# Patient Record
Sex: Female | Born: 1965 | Race: White | Hispanic: No | Smoking: Former smoker
Health system: Southern US, Community
[De-identification: ages and names within clinical notes are randomized; demographics above are authoritative.]

## PROBLEM LIST (undated history)

## (undated) DIAGNOSIS — Z9889 Other specified postprocedural states: Secondary | ICD-10-CM

## (undated) DIAGNOSIS — F329 Major depressive disorder, single episode, unspecified: Secondary | ICD-10-CM

## (undated) DIAGNOSIS — J309 Allergic rhinitis, unspecified: Secondary | ICD-10-CM

## (undated) DIAGNOSIS — J45909 Unspecified asthma, uncomplicated: Secondary | ICD-10-CM

## (undated) DIAGNOSIS — F32A Depression, unspecified: Secondary | ICD-10-CM

## (undated) DIAGNOSIS — M5126 Other intervertebral disc displacement, lumbar region: Secondary | ICD-10-CM

## (undated) DIAGNOSIS — K2970 Gastritis, unspecified, without bleeding: Secondary | ICD-10-CM

## (undated) DIAGNOSIS — M5136 Other intervertebral disc degeneration, lumbar region: Secondary | ICD-10-CM

## (undated) DIAGNOSIS — Z87891 Personal history of nicotine dependence: Secondary | ICD-10-CM

## (undated) DIAGNOSIS — F3181 Bipolar II disorder: Secondary | ICD-10-CM

## (undated) DIAGNOSIS — Z87898 Personal history of other specified conditions: Secondary | ICD-10-CM

## (undated) DIAGNOSIS — G4730 Sleep apnea, unspecified: Secondary | ICD-10-CM

## (undated) DIAGNOSIS — Q2112 Patent foramen ovale: Secondary | ICD-10-CM

## (undated) DIAGNOSIS — F419 Anxiety disorder, unspecified: Secondary | ICD-10-CM

## (undated) DIAGNOSIS — R112 Nausea with vomiting, unspecified: Secondary | ICD-10-CM

## (undated) DIAGNOSIS — M25561 Pain in right knee: Secondary | ICD-10-CM

## (undated) DIAGNOSIS — M51369 Other intervertebral disc degeneration, lumbar region without mention of lumbar back pain or lower extremity pain: Secondary | ICD-10-CM

## (undated) HISTORY — DX: Other intervertebral disc degeneration, lumbar region: M51.36

## (undated) HISTORY — DX: Major depressive disorder, single episode, unspecified: F32.9

## (undated) HISTORY — DX: Anxiety disorder, unspecified: F41.9

## (undated) HISTORY — DX: Other intervertebral disc displacement, lumbar region: M51.26

## (undated) HISTORY — DX: Unspecified asthma, uncomplicated: J45.909

## (undated) HISTORY — DX: Gastritis, unspecified, without bleeding: K29.70

## (undated) HISTORY — DX: Bipolar II disorder: F31.81

## (undated) HISTORY — DX: Depression, unspecified: F32.A

## (undated) HISTORY — DX: Pain in right knee: M25.561

## (undated) HISTORY — DX: Other intervertebral disc degeneration, lumbar region without mention of lumbar back pain or lower extremity pain: M51.369

## (undated) HISTORY — DX: Allergic rhinitis, unspecified: J30.9

## (undated) HISTORY — DX: Personal history of nicotine dependence: Z87.891

## (undated) HISTORY — PX: WISDOM TOOTH EXTRACTION: SHX21

## (undated) HISTORY — DX: Personal history of other specified conditions: Z87.898

---

## 1999-09-28 ENCOUNTER — Other Ambulatory Visit: Admission: RE | Admit: 1999-09-28 | Discharge: 1999-09-28 | Payer: Self-pay | Admitting: Obstetrics & Gynecology

## 2001-06-22 ENCOUNTER — Other Ambulatory Visit: Admission: RE | Admit: 2001-06-22 | Discharge: 2001-06-22 | Payer: Self-pay | Admitting: Obstetrics & Gynecology

## 2002-01-22 ENCOUNTER — Emergency Department (HOSPITAL_COMMUNITY): Admission: EM | Admit: 2002-01-22 | Discharge: 2002-01-22 | Payer: Self-pay

## 2002-08-23 ENCOUNTER — Other Ambulatory Visit: Admission: RE | Admit: 2002-08-23 | Discharge: 2002-08-23 | Payer: Self-pay | Admitting: Obstetrics & Gynecology

## 2003-10-10 ENCOUNTER — Other Ambulatory Visit: Admission: RE | Admit: 2003-10-10 | Discharge: 2003-10-10 | Payer: Self-pay | Admitting: Obstetrics & Gynecology

## 2003-10-17 ENCOUNTER — Encounter: Admission: RE | Admit: 2003-10-17 | Discharge: 2003-10-17 | Payer: Self-pay | Admitting: Obstetrics & Gynecology

## 2004-10-15 ENCOUNTER — Encounter: Admission: RE | Admit: 2004-10-15 | Discharge: 2004-10-15 | Payer: Self-pay | Admitting: Obstetrics & Gynecology

## 2004-10-29 ENCOUNTER — Encounter: Admission: RE | Admit: 2004-10-29 | Discharge: 2004-10-29 | Payer: Self-pay | Admitting: Obstetrics & Gynecology

## 2004-12-17 ENCOUNTER — Other Ambulatory Visit: Admission: RE | Admit: 2004-12-17 | Discharge: 2004-12-17 | Payer: Self-pay | Admitting: Obstetrics & Gynecology

## 2006-03-10 ENCOUNTER — Encounter: Admission: RE | Admit: 2006-03-10 | Discharge: 2006-03-10 | Payer: Self-pay | Admitting: Obstetrics & Gynecology

## 2006-03-12 ENCOUNTER — Encounter: Admission: RE | Admit: 2006-03-12 | Discharge: 2006-03-12 | Payer: Self-pay | Admitting: Obstetrics & Gynecology

## 2006-12-22 ENCOUNTER — Encounter: Admission: RE | Admit: 2006-12-22 | Payer: Self-pay | Admitting: Obstetrics & Gynecology

## 2008-03-24 ENCOUNTER — Encounter: Admission: RE | Admit: 2008-03-24 | Discharge: 2008-03-24 | Payer: Self-pay | Admitting: Obstetrics & Gynecology

## 2009-04-02 ENCOUNTER — Encounter: Admission: RE | Admit: 2009-04-02 | Discharge: 2009-04-02 | Payer: Self-pay | Admitting: Obstetrics & Gynecology

## 2010-04-02 ENCOUNTER — Encounter: Admission: RE | Admit: 2010-04-02 | Discharge: 2010-04-02 | Payer: Self-pay | Admitting: Obstetrics & Gynecology

## 2010-04-22 ENCOUNTER — Emergency Department (HOSPITAL_COMMUNITY)
Admission: EM | Admit: 2010-04-22 | Discharge: 2010-04-22 | Payer: Self-pay | Source: Home / Self Care | Admitting: Emergency Medicine

## 2010-08-22 HISTORY — PX: TURBINATE REDUCTION: SHX6157

## 2010-08-22 HISTORY — PX: SEPTOPLASTY: SUR1290

## 2010-08-22 HISTORY — PX: OTHER SURGICAL HISTORY: SHX169

## 2010-08-22 HISTORY — PX: ESOPHAGOGASTRODUODENOSCOPY: SHX1529

## 2010-08-23 ENCOUNTER — Emergency Department (HOSPITAL_COMMUNITY)
Admission: EM | Admit: 2010-08-23 | Discharge: 2010-08-23 | Disposition: A | Discharge status: Home / Self Care | Payer: 59 | Attending: Emergency Medicine | Admitting: Emergency Medicine

## 2010-08-23 DIAGNOSIS — R112 Nausea with vomiting, unspecified: Secondary | ICD-10-CM | POA: Insufficient documentation

## 2010-08-23 DIAGNOSIS — R197 Diarrhea, unspecified: Secondary | ICD-10-CM | POA: Insufficient documentation

## 2010-08-23 LAB — DIFFERENTIAL
Basophils Relative: 0 % (ref 0–1)
Eosinophils Absolute: 0.1 10*3/uL (ref 0.0–0.7)
Lymphocytes Relative: 16 % (ref 12–46)
Lymphs Abs: 1.1 10*3/uL (ref 0.7–4.0)

## 2010-08-23 LAB — BASIC METABOLIC PANEL
CO2: 23 mEq/L (ref 19–32)
Chloride: 112 mEq/L (ref 96–112)
Creatinine, Ser: 0.59 mg/dL (ref 0.4–1.2)
Potassium: 3.4 mEq/L — ABNORMAL LOW (ref 3.5–5.1)
Sodium: 138 mEq/L (ref 135–145)

## 2010-08-23 LAB — CBC
MCHC: 32.9 g/dL (ref 30.0–36.0)
RBC: 3.83 MIL/uL — ABNORMAL LOW (ref 3.87–5.11)
RDW: 12.2 % (ref 11.5–15.5)
WBC: 7 10*3/uL (ref 4.0–10.5)

## 2010-10-01 ENCOUNTER — Encounter (HOSPITAL_COMMUNITY): Payer: 59

## 2010-10-08 ENCOUNTER — Ambulatory Visit (HOSPITAL_COMMUNITY)
Admission: RE | Admit: 2010-10-08 | Discharge: 2010-10-08 | Disposition: A | Discharge status: Home / Self Care | Payer: 59 | Source: Ambulatory Visit | Attending: Family Medicine | Admitting: Family Medicine

## 2010-10-08 DIAGNOSIS — J45909 Unspecified asthma, uncomplicated: Secondary | ICD-10-CM | POA: Insufficient documentation

## 2011-06-01 ENCOUNTER — Other Ambulatory Visit: Payer: Self-pay | Admitting: Obstetrics & Gynecology

## 2011-06-01 DIAGNOSIS — Z1231 Encounter for screening mammogram for malignant neoplasm of breast: Secondary | ICD-10-CM

## 2011-06-14 ENCOUNTER — Ambulatory Visit
Admission: RE | Admit: 2011-06-14 | Discharge: 2011-06-14 | Disposition: A | Discharge status: Home / Self Care | Payer: 59 | Source: Ambulatory Visit | Attending: Obstetrics & Gynecology | Admitting: Obstetrics & Gynecology

## 2011-06-14 DIAGNOSIS — Z1231 Encounter for screening mammogram for malignant neoplasm of breast: Secondary | ICD-10-CM

## 2012-01-20 ENCOUNTER — Ambulatory Visit: Payer: Self-pay

## 2012-01-25 ENCOUNTER — Telehealth: Payer: Self-pay

## 2012-01-25 NOTE — Telephone Encounter (Signed)
LMOM to CB. We received lab results back (not in EPIC, results at [facility]' desk). Tell pt (per Dr Katrinka Blazing) that Hep B test was neg and there is not evidence of active Hep B.  Pt CB immediately and gave her results. She stated that she will RTC for add'l testing as instr'd.

## 2012-03-13 ENCOUNTER — Telehealth: Payer: Self-pay

## 2012-03-13 NOTE — Telephone Encounter (Signed)
PT STATES SHE IS TO GET MORE BLOOD WORK AND DIDN'T REMEMBER WHEN SHE IS TO COME BACK, DIDN'T KNOW IF IT WAS 6 WEEKS OR NOT PLEASE CALL [phone]

## 2012-03-13 NOTE — Telephone Encounter (Signed)
LMOM for pt to return call. 

## 2012-03-13 NOTE — Telephone Encounter (Signed)
Spoke to patient. This note should not be in Epic should be in [place].

## 2012-05-30 ENCOUNTER — Other Ambulatory Visit: Payer: Self-pay | Admitting: Obstetrics & Gynecology

## 2012-05-30 DIAGNOSIS — Z1231 Encounter for screening mammogram for malignant neoplasm of breast: Secondary | ICD-10-CM

## 2012-06-22 ENCOUNTER — Other Ambulatory Visit: Payer: Self-pay | Admitting: Physician Assistant

## 2012-06-23 LAB — ALT: ALT: 14 U/L (ref 0–35)

## 2012-07-04 ENCOUNTER — Ambulatory Visit
Admission: RE | Admit: 2012-07-04 | Discharge: 2012-07-04 | Disposition: A | Discharge status: Home / Self Care | Payer: 59 | Source: Ambulatory Visit | Attending: Obstetrics & Gynecology | Admitting: Obstetrics & Gynecology

## 2012-07-04 DIAGNOSIS — Z1231 Encounter for screening mammogram for malignant neoplasm of breast: Secondary | ICD-10-CM

## 2012-07-09 ENCOUNTER — Other Ambulatory Visit: Payer: Self-pay | Admitting: Obstetrics & Gynecology

## 2012-07-09 DIAGNOSIS — R928 Other abnormal and inconclusive findings on diagnostic imaging of breast: Secondary | ICD-10-CM

## 2012-07-11 ENCOUNTER — Ambulatory Visit
Admission: RE | Admit: 2012-07-11 | Discharge: 2012-07-11 | Disposition: A | Discharge status: Home / Self Care | Payer: 59 | Source: Ambulatory Visit | Attending: Obstetrics & Gynecology | Admitting: Obstetrics & Gynecology

## 2012-07-11 DIAGNOSIS — R928 Other abnormal and inconclusive findings on diagnostic imaging of breast: Secondary | ICD-10-CM

## 2012-07-13 ENCOUNTER — Other Ambulatory Visit: Payer: 59

## 2012-07-13 ENCOUNTER — Ambulatory Visit: Payer: 59

## 2012-07-16 ENCOUNTER — Telehealth: Payer: Self-pay | Admitting: *Deleted

## 2012-07-16 NOTE — Telephone Encounter (Signed)
Confirmed 07/30/12 genetic appt w/ pt.  Emailed Leigh at [facility] to make her aware.

## 2012-07-30 ENCOUNTER — Ambulatory Visit (HOSPITAL_BASED_OUTPATIENT_CLINIC_OR_DEPARTMENT_OTHER): Payer: 59 | Admitting: Genetic Counselor

## 2012-07-30 ENCOUNTER — Other Ambulatory Visit: Payer: 59 | Admitting: Lab

## 2012-07-30 DIAGNOSIS — Z8041 Family history of malignant neoplasm of ovary: Secondary | ICD-10-CM

## 2012-07-30 DIAGNOSIS — Z803 Family history of malignant neoplasm of breast: Secondary | ICD-10-CM

## 2012-07-31 ENCOUNTER — Encounter: Payer: Self-pay | Admitting: Genetic Counselor

## 2012-07-31 NOTE — Progress Notes (Signed)
Dr.  Carrington Clamp requested a consultation for genetic counseling and risk assessment for Mariah Vaughn, a 47 y.o. female, for discussion of her family history of breast and ovarian cancer. She presents to clinic today to discuss the possibility of a genetic predisposition to cancer, and to further clarify her risks, as well as her family members' risks for cancer.   HISTORY OF PRESENT ILLNESS: Mariah Vaughn is a 47 y.o. female with no personal history of cancer.    History reviewed. No pertinent past medical history.  History reviewed. No pertinent past surgical history.  History  Substance Use Topics  . Smoking status: Former Smoker -- 1.50 packs/day for 25 years    Types: Cigarettes    Quit date: 08/01/2003  . Smokeless tobacco: Not on file  . Alcohol Use: Yes     Comment: rarely    REPRODUCTIVE HISTORY AND PERSONAL RISK ASSESSMENT FACTORS: Menarche was at age 74.   premenopausal Uterus Intact: Yes Ovaries Intact: Yes [id number] , first live birth at age N/A  She has not previously undergone treatment for infertility.   OCP use for 1-2 monhts   She has not used HRT in the past.    FAMILY HISTORY:  We obtained a detailed, 4-generation family history.  Significant diagnoses are listed below: Family History  Problem Relation Age of Onset  . Breast cancer Mother 43  . Ovarian cancer Sister 56    maternal half sister  . Lung cancer Maternal Grandmother     worked at [employer]  . Prostate cancer Paternal Grandfather 96  The patient's maternal half sister had ovarian cancer at age 66.   Her mother had breast cancer at age 58, and maternal grandmother and her sister had lung cancer as a result of smoking and working at [employer].  The patient's paternal grandfather died of prostate cancer at age 105.  Patient's maternal ancestors are of [place], [place] and [place] descent, and paternal ancestors are of [place], [place], [place] and [place] descent. There  is no reported Ashkenazi Jewish ancestry. There is no known consanguinity.  GENETIC COUNSELING RISK ASSESSMENT, DISCUSSION, AND SUGGESTED FOLLOW UP: We reviewed the natural history and genetic etiology of sporadic, familial and hereditary cancer syndromes.  About 5-10% of breast cancer is hereditary.  Of this, about 85% is the result of a BRCA1 or BRCA2 mutation.  We reviewed the red flags of hereditary cancer syndromes and the dominant inheritance patterns.  If the BRCA testing is negative, we discussed that we could be testing for the wrong gene.  We discussed gene panels, and that several cancer genes that are associated with different cancers can be tested at the same time.  Because of the different types of cancer that are in the patient's family, we will consider one of the panel tests if she is negative for BRCA mutations.   The patient's family history of breast and ovarian cancer is suggestive of the following possible diagnosis: hereditary cancer syndrome  We discussed that identification of a hereditary cancer syndrome may help her care providers tailor the patients medical management. If a mutation indicating a hereditary cancer syndrome is detected in this case, the [employer] recommendations would include increased cancer surveillance and possible prophylactic surgery. If a mutation is detected, the patient will be referred back to the referring provider and to any additional appropriate care providers to discuss the relevant options.   If a mutation is not found in the patient,  cancer surveillance options would be discussed for the patient according to the appropriate standard National Comprehensive Cancer Network and American Cancer Society guidelines, with consideration of their personal and family history risk factors. In this case, the patient will be referred back to their care providers for discussions of management.   In order to estimate her chance of  having a BRCA mutation, we used statistical models (Tyrer Cusik) and laboratory data that take into account her personal medical history, family history and ancestry.  Because each model is different, there can be a lot of variability in the risks they give.  Therefore, these numbers must be considered a rough range and not a precise risk of having a BRCA mutation.  These models estimate that she has approximately a 1.5% chance of having a mutation.   Based on the patient's personal and family history, statistical models (Tyrer cusik)  and literature data were used to estimate her risk of developing breast cancer. These estimate her lifetime risk of developing breast cancer to be approximately 29%. This estimation does not take into account any genetic testing results.   After considering the risks, benefits, and limitations, the patient provided informed consent for  the following  testing: breast/ovarian cancer syndrome panel through [facility].   Per the patient's request, we will contact her by telephone to discuss these results. A follow up genetic counseling visit will be scheduled if indicated.  The patient was seen for a total of 60 minutes, greater than 50% of which was spent face-to-face counseling.  This plan is being carried out per Dr. Marcelino Duster Horvath's recommendations.  This note will also be sent to the referring provider via the electronic medical record. The patient will be supplied with a summary of this genetic counseling discussion as well as educational information on the discussed hereditary cancer syndromes following the conclusion of their visit.   Patient was discussed with Dr. Drue Second.  _______________________________________________________________________ For Office Staff:  Number of people involved in session: 2 Was an Intern/ student involved with case: no }

## 2012-08-30 ENCOUNTER — Telehealth: Payer: Self-pay | Admitting: Genetic Counselor

## 2012-08-30 NOTE — Telephone Encounter (Signed)
Revealed negative genetic test results 

## 2012-09-10 ENCOUNTER — Encounter: Payer: Self-pay | Admitting: Genetic Counselor

## 2013-05-08 ENCOUNTER — Telehealth: Payer: Self-pay | Admitting: Interventional Cardiology

## 2013-05-08 NOTE — Telephone Encounter (Signed)
Pt is aware that her primary cardiologist was Dr Nechama Guard . Pt has  last office visit was 06/30/2009, so she will be seen as a new pt. Pt states she has an appointment with her PCP today because she has some health issues, Pt states that her PCP will referred her to a cardiologist if she needs to see one.

## 2013-05-08 NOTE — Telephone Encounter (Signed)
New message   Pt called stating that she is not sure if Dr. Eldridge Dace is her Cardiologist// Pt states that she has not seen her cardiologist in over 1-2 years and she has recently felt symptoms of tachycardia.. Please call back to confirm if this pt is or isn't Dr. Hoyle Barr pt// Please assist.

## 2013-05-09 ENCOUNTER — Ambulatory Visit (INDEPENDENT_AMBULATORY_CARE_PROVIDER_SITE_OTHER): Payer: 59 | Admitting: Interventional Cardiology

## 2013-05-09 ENCOUNTER — Encounter: Payer: Self-pay | Admitting: Interventional Cardiology

## 2013-05-09 ENCOUNTER — Encounter (INDEPENDENT_AMBULATORY_CARE_PROVIDER_SITE_OTHER): Payer: Self-pay

## 2013-05-09 VITALS — BP 123/81 | HR 82 | Ht 64.0 in | Wt 135.0 lb

## 2013-05-09 DIAGNOSIS — R0602 Shortness of breath: Secondary | ICD-10-CM

## 2013-05-09 DIAGNOSIS — R9430 Abnormal result of cardiovascular function study, unspecified: Secondary | ICD-10-CM

## 2013-05-09 DIAGNOSIS — R079 Chest pain, unspecified: Secondary | ICD-10-CM

## 2013-05-09 LAB — BASIC METABOLIC PANEL
BUN: 11 mg/dL (ref 6–23)
Calcium: 9 mg/dL (ref 8.4–10.5)
Chloride: 106 mEq/L (ref 96–112)
Creatinine, Ser: 0.7 mg/dL (ref 0.4–1.2)

## 2013-05-09 NOTE — Progress Notes (Signed)
Patient ID: Odesser Tourangeau, female   DOB: 22-Nov-1965, 47 y.o.   MRN: [id number]    [address] [place], [place]  [address] Phone: [phone] Fax:  [phone]  Date:  05/09/2013   ID:  Mariah Vaughn, DOB 09-24-1965, MRN [id number]  PCP:  Neldon Labella, MD      History of Present Illness: Mariah Vaughn is a 47 y.o. female who has had chest pressure over the past 2 days.  She has had some intermittent SHOB.  THis got worse yesterday.  She wears a mask at work.  She has had some left arm numbness.  She has gained weight.  She has not been exercising of late. She does note if he pushes on a certain spot in her chest, she can reproduce the pain. There's been no associated nausea, sweating, vomiting. She has been under a fair amount of stress as well do to having worked 2 jobs.   Wt Readings from Last 3 Encounters:  05/09/13 190 lb (86.183 kg)     No past medical history on file.  Current Outpatient Prescriptions  Medication Sig Dispense Refill  . ABILIFY 5 MG tablet Take 5 mg by mouth once.       . citalopram (CELEXA) 10 MG tablet Take 10 mg by mouth daily.       Marland Kitchen NITROSTAT 0.4 MG SL tablet Place 0.4 mg under the tongue every 5 (five) minutes as needed.        No current facility-administered medications for this visit.    Allergies:    Allergies  Allergen Reactions  . Amoxicillin   . Codeine   . Keflex [Cephalexin]   . Sulfur     Social History:  The patient  reports that she quit smoking about 9 years ago. Her smoking use included Cigarettes. She has a 37.5 pack-year smoking history. She does not have any smokeless tobacco history on file. She reports that she drinks alcohol.   Family History:  The patient's family history includes Breast cancer (age of onset: 65) in her mother; Lung cancer in her maternal grandmother; Ovarian cancer (age of onset: 76) in her sister; Prostate cancer (age of onset: 79) in her paternal grandfather; Stroke in her father.    ROS:  Please see the history of present illness.  No nausea, vomiting.  No fevers, chills.  No focal weakness.  No dysuria.    All other systems reviewed and negative.   PHYSICAL EXAM: VS:  BP 123/81  Pulse 82  Ht 5\' 11"  (1.803 m)  Wt 190 lb (86.183 kg)  BMI 26.51 kg/m2 Well nourished, well developed, in no acute distress HEENT: normal Neck: no JVD, no carotid bruits Cardiac:  normal S1, S2; RRR;  Lungs:  clear to auscultation bilaterally, no wheezing, rhonchi or rales Abd: soft, nontender, no hepatomegaly Ext: no edema Skin: warm and dry Neuro:   no focal abnormalities noted  EKG:  Normal sinus rhythm, nonspecific ST segment changes in the inferior and lateral leads. Lateral Q waves present.  No significant change from prior ECG.  There may be lead placement issues since lead one now looks like lead aVL.    ASSESSMENT AND PLAN:  1.  CP- atypical.  Worse with palpation.  Reviewed ECG.  No significant change from 2011.  ECG.  Normal echo in 2011.  Plan for ETT.    2. SHOB: Check BNP.  Eval or fluid overload.  SHe has gained weight .  WIll need  to increase exercise in the long term.    3. abnormal ECG: There were the same ST segment changes present 2011. There is a new Q wave in lead 1. There were previously Q waves in lead aVL. She has not had convincing symptoms of ischemia. We'll see with exercise treadmill test shows.  Signed, Fredric Mare, MD, [facility] 05/09/2013 10:28 AM

## 2013-05-09 NOTE — Patient Instructions (Signed)
Your physician has requested that you have an exercise tolerance test. For further information please visit [email or web address] Please also follow instruction sheet, as given.  Your physician recommends that you return for lab work today for Bmet and BNP.

## 2013-05-10 DIAGNOSIS — R9430 Abnormal result of cardiovascular function study, unspecified: Secondary | ICD-10-CM | POA: Insufficient documentation

## 2013-05-27 ENCOUNTER — Encounter: Payer: 59 | Admitting: Interventional Cardiology

## 2013-06-14 ENCOUNTER — Ambulatory Visit (INDEPENDENT_AMBULATORY_CARE_PROVIDER_SITE_OTHER): Payer: 59 | Admitting: Physician Assistant

## 2013-06-14 ENCOUNTER — Encounter (INDEPENDENT_AMBULATORY_CARE_PROVIDER_SITE_OTHER): Payer: Self-pay

## 2013-06-14 DIAGNOSIS — R0602 Shortness of breath: Secondary | ICD-10-CM

## 2013-06-14 DIAGNOSIS — R079 Chest pain, unspecified: Secondary | ICD-10-CM

## 2013-06-14 NOTE — Progress Notes (Signed)
Exercise Treadmill Test  Pre-Exercise Testing Evaluation Rhythm: normal sinus  Rate: 77 bpm     Test  Exercise Tolerance Test Ordering MD: Casandra Doffing, MD  Interpreting MD: Richardson Dopp, PA-C  Unique Test No: 1  Treadmill:  1  Indication for ETT: chest pain - rule out ischemia  Contraindication to ETT: No   Stress Modality: exercise - treadmill  Cardiac Imaging Performed: non   Protocol: standard Bruce - maximal  Max BP:  148/65  Max MPHR (bpm):  175 85% MPR (bpm):  149  MPHR obtained (bpm):  176 % MPHR obtained:  100  Reached 85% MPHR (min:sec):  8:34 Total Exercise Time (min-sec):  10:03  Workload in METS:  11.8 Borg Scale: 16  Reason ETT Terminated:  patient's desire to stop    ST Segment Analysis At Rest: normal ST segments - no evidence of significant ST depression With Exercise: no evidence of significant ST depression  Other Information Arrhythmia:  No Angina during ETT:  absent (0) Quality of ETT:  diagnostic  ETT Interpretation:  normal - no evidence of ischemia by ST analysis  Comments: Good exercise capacity. No chest pain. Normal BP response to exercise. No ST changes to suggest ischemia.   Recommendations: F/u with Dr. Casandra Doffing as directed. Signed,  Richardson Dopp, PA-C   06/14/2013 9:44 AM

## 2013-07-22 ENCOUNTER — Other Ambulatory Visit: Payer: Self-pay | Admitting: Obstetrics & Gynecology

## 2013-07-22 DIAGNOSIS — N644 Mastodynia: Secondary | ICD-10-CM

## 2013-08-01 ENCOUNTER — Ambulatory Visit
Admission: RE | Admit: 2013-08-01 | Discharge: 2013-08-01 | Disposition: A | Discharge status: Home / Self Care | Payer: Self-pay | Source: Ambulatory Visit | Attending: Obstetrics & Gynecology | Admitting: Obstetrics & Gynecology

## 2013-08-01 ENCOUNTER — Ambulatory Visit
Admission: RE | Admit: 2013-08-01 | Discharge: 2013-08-01 | Disposition: A | Discharge status: Home / Self Care | Payer: 59 | Source: Ambulatory Visit | Attending: Obstetrics & Gynecology | Admitting: Obstetrics & Gynecology

## 2013-08-01 DIAGNOSIS — N644 Mastodynia: Secondary | ICD-10-CM

## 2014-01-02 ENCOUNTER — Other Ambulatory Visit: Payer: Self-pay | Admitting: Obstetrics & Gynecology

## 2014-01-02 DIAGNOSIS — N63 Unspecified lump in unspecified breast: Secondary | ICD-10-CM

## 2014-02-03 ENCOUNTER — Encounter (INDEPENDENT_AMBULATORY_CARE_PROVIDER_SITE_OTHER): Payer: Self-pay

## 2014-02-03 ENCOUNTER — Ambulatory Visit
Admission: RE | Admit: 2014-02-03 | Discharge: 2014-02-03 | Disposition: A | Discharge status: Home / Self Care | Payer: 59 | Source: Ambulatory Visit | Attending: Obstetrics & Gynecology | Admitting: Obstetrics & Gynecology

## 2014-02-03 DIAGNOSIS — N63 Unspecified lump in unspecified breast: Secondary | ICD-10-CM

## 2014-03-06 ENCOUNTER — Other Ambulatory Visit: Payer: Self-pay | Admitting: Family Medicine

## 2014-03-06 DIAGNOSIS — R208 Other disturbances of skin sensation: Secondary | ICD-10-CM

## 2014-03-10 ENCOUNTER — Ambulatory Visit
Admission: RE | Admit: 2014-03-10 | Discharge: 2014-03-10 | Disposition: A | Discharge status: Home / Self Care | Payer: 59 | Source: Ambulatory Visit | Attending: Family Medicine | Admitting: Family Medicine

## 2014-03-10 DIAGNOSIS — R208 Other disturbances of skin sensation: Secondary | ICD-10-CM

## 2014-03-10 MED ORDER — GADOBENATE DIMEGLUMINE 529 MG/ML IV SOLN
13.0000 mL | Freq: Once | INTRAVENOUS | Status: AC | PRN
  Administered 2014-03-10: 13 mL via INTRAVENOUS

## 2014-07-24 ENCOUNTER — Other Ambulatory Visit: Payer: Self-pay

## 2014-07-24 DIAGNOSIS — Z1231 Encounter for screening mammogram for malignant neoplasm of breast: Secondary | ICD-10-CM

## 2014-08-29 ENCOUNTER — Ambulatory Visit
Admission: RE | Admit: 2014-08-29 | Discharge: 2014-08-29 | Disposition: A | Discharge status: Home / Self Care | Payer: 59 | Source: Ambulatory Visit

## 2014-08-29 DIAGNOSIS — Z1231 Encounter for screening mammogram for malignant neoplasm of breast: Secondary | ICD-10-CM

## 2014-10-30 ENCOUNTER — Other Ambulatory Visit: Payer: Self-pay | Admitting: Family Medicine

## 2014-10-30 DIAGNOSIS — R1111 Vomiting without nausea: Secondary | ICD-10-CM

## 2014-11-03 ENCOUNTER — Ambulatory Visit
Admission: RE | Admit: 2014-11-03 | Discharge: 2014-11-03 | Disposition: A | Discharge status: Home / Self Care | Payer: 59 | Source: Ambulatory Visit | Attending: Family Medicine | Admitting: Family Medicine

## 2014-11-03 DIAGNOSIS — R1111 Vomiting without nausea: Secondary | ICD-10-CM

## 2014-12-22 HISTORY — PX: ESOPHAGOGASTRODUODENOSCOPY: SHX1529

## 2015-01-28 ENCOUNTER — Other Ambulatory Visit (HOSPITAL_COMMUNITY): Payer: Self-pay | Admitting: Gastroenterology

## 2015-01-28 DIAGNOSIS — R1110 Vomiting, unspecified: Secondary | ICD-10-CM

## 2015-02-13 ENCOUNTER — Ambulatory Visit (HOSPITAL_COMMUNITY)
Admission: RE | Admit: 2015-02-13 | Discharge: 2015-02-13 | Disposition: A | Discharge status: Home / Self Care | Payer: 59 | Source: Ambulatory Visit | Attending: Gastroenterology | Admitting: Gastroenterology

## 2015-02-13 DIAGNOSIS — R112 Nausea with vomiting, unspecified: Secondary | ICD-10-CM | POA: Insufficient documentation

## 2015-02-13 DIAGNOSIS — R1110 Vomiting, unspecified: Secondary | ICD-10-CM

## 2015-02-13 MED ORDER — TECHNETIUM TC 99M MEBROFENIN IV KIT
5.4000 | PACK | Freq: Once | INTRAVENOUS | Status: DC | PRN
  Administered 2015-02-13: 5 via INTRAVENOUS
  Filled 2015-02-13: qty 6

## 2015-02-20 ENCOUNTER — Ambulatory Visit (HOSPITAL_COMMUNITY): Payer: 59

## 2015-03-06 ENCOUNTER — Ambulatory Visit (HOSPITAL_COMMUNITY)
Admission: RE | Admit: 2015-03-06 | Discharge: 2015-03-06 | Disposition: A | Discharge status: Home / Self Care | Payer: 59 | Source: Ambulatory Visit | Attending: Gastroenterology | Admitting: Gastroenterology

## 2015-03-06 DIAGNOSIS — R6881 Early satiety: Secondary | ICD-10-CM | POA: Insufficient documentation

## 2015-03-06 DIAGNOSIS — R1110 Vomiting, unspecified: Secondary | ICD-10-CM

## 2015-03-06 DIAGNOSIS — R112 Nausea with vomiting, unspecified: Secondary | ICD-10-CM | POA: Diagnosis not present

## 2015-03-06 DIAGNOSIS — R140 Abdominal distension (gaseous): Secondary | ICD-10-CM | POA: Insufficient documentation

## 2015-03-06 DIAGNOSIS — R1013 Epigastric pain: Secondary | ICD-10-CM | POA: Diagnosis not present

## 2015-03-06 MED ORDER — TECHNETIUM TC 99M SULFUR COLLOID
2.1000 | Freq: Once | INTRAVENOUS | Status: DC | PRN
  Administered 2015-03-06: 2.1 via ORAL
  Filled 2015-03-06: qty 2.1

## 2015-07-15 ENCOUNTER — Ambulatory Visit: Payer: Self-pay

## 2015-07-15 ENCOUNTER — Other Ambulatory Visit: Payer: Self-pay | Admitting: Occupational Medicine

## 2015-07-15 DIAGNOSIS — M25532 Pain in left wrist: Secondary | ICD-10-CM

## 2015-07-29 ENCOUNTER — Ambulatory Visit: Payer: Self-pay

## 2015-07-29 ENCOUNTER — Other Ambulatory Visit: Payer: Self-pay | Admitting: Occupational Medicine

## 2015-07-29 DIAGNOSIS — M25532 Pain in left wrist: Secondary | ICD-10-CM

## 2015-08-25 ENCOUNTER — Other Ambulatory Visit: Payer: Self-pay | Admitting: Family Medicine

## 2015-08-25 DIAGNOSIS — M7989 Other specified soft tissue disorders: Secondary | ICD-10-CM

## 2015-08-30 ENCOUNTER — Ambulatory Visit
Admission: RE | Admit: 2015-08-30 | Discharge: 2015-08-30 | Disposition: A | Discharge status: Home / Self Care | Payer: 59 | Source: Ambulatory Visit | Attending: Family Medicine | Admitting: Family Medicine

## 2015-08-30 DIAGNOSIS — M7989 Other specified soft tissue disorders: Secondary | ICD-10-CM

## 2015-08-30 MED ORDER — GADOBENATE DIMEGLUMINE 529 MG/ML IV SOLN
14.0000 mL | Freq: Once | INTRAVENOUS | Status: AC | PRN
  Administered 2015-08-30: 14 mL via INTRAVENOUS

## 2015-11-30 ENCOUNTER — Other Ambulatory Visit: Payer: Self-pay | Admitting: Obstetrics and Gynecology

## 2015-11-30 ENCOUNTER — Other Ambulatory Visit: Payer: Self-pay | Admitting: Obstetrics & Gynecology

## 2015-11-30 DIAGNOSIS — Z1231 Encounter for screening mammogram for malignant neoplasm of breast: Secondary | ICD-10-CM

## 2015-12-01 ENCOUNTER — Ambulatory Visit
Admission: RE | Admit: 2015-12-01 | Discharge: 2015-12-01 | Disposition: A | Discharge status: Home / Self Care | Payer: 59 | Source: Ambulatory Visit | Attending: Obstetrics and Gynecology | Admitting: Obstetrics and Gynecology

## 2015-12-01 DIAGNOSIS — Z1231 Encounter for screening mammogram for malignant neoplasm of breast: Secondary | ICD-10-CM

## 2016-06-16 ENCOUNTER — Emergency Department (HOSPITAL_COMMUNITY)
Admission: EM | Admit: 2016-06-16 | Discharge: 2016-06-16 | Disposition: A | Discharge status: Home / Self Care | Payer: 59

## 2016-06-16 DIAGNOSIS — S61210A Laceration without foreign body of right index finger without damage to nail, initial encounter: Secondary | ICD-10-CM | POA: Diagnosis not present

## 2016-06-21 DIAGNOSIS — S61210D Laceration without foreign body of right index finger without damage to nail, subsequent encounter: Secondary | ICD-10-CM | POA: Diagnosis not present

## 2016-06-27 DIAGNOSIS — S61210D Laceration without foreign body of right index finger without damage to nail, subsequent encounter: Secondary | ICD-10-CM | POA: Diagnosis not present

## 2016-06-30 DIAGNOSIS — S61210D Laceration without foreign body of right index finger without damage to nail, subsequent encounter: Secondary | ICD-10-CM | POA: Diagnosis not present

## 2016-08-31 DIAGNOSIS — Z1159 Encounter for screening for other viral diseases: Secondary | ICD-10-CM | POA: Diagnosis not present

## 2016-08-31 DIAGNOSIS — Z0000 Encounter for general adult medical examination without abnormal findings: Secondary | ICD-10-CM | POA: Diagnosis not present

## 2016-08-31 DIAGNOSIS — Z20828 Contact with and (suspected) exposure to other viral communicable diseases: Secondary | ICD-10-CM | POA: Diagnosis not present

## 2016-09-20 ENCOUNTER — Other Ambulatory Visit (HOSPITAL_BASED_OUTPATIENT_CLINIC_OR_DEPARTMENT_OTHER): Payer: Self-pay | Admitting: Family Medicine

## 2016-09-20 ENCOUNTER — Other Ambulatory Visit: Payer: Self-pay | Admitting: Family Medicine

## 2016-09-20 ENCOUNTER — Ambulatory Visit
Admission: RE | Admit: 2016-09-20 | Discharge: 2016-09-20 | Disposition: A | Discharge status: Home / Self Care | Payer: 59 | Source: Ambulatory Visit | Attending: Family Medicine | Admitting: Family Medicine

## 2016-09-20 ENCOUNTER — Ambulatory Visit (HOSPITAL_BASED_OUTPATIENT_CLINIC_OR_DEPARTMENT_OTHER)
Admission: RE | Admit: 2016-09-20 | Discharge: 2016-09-20 | Disposition: A | Discharge status: Home / Self Care | Payer: 59 | Source: Ambulatory Visit | Attending: Family Medicine | Admitting: Family Medicine

## 2016-09-20 DIAGNOSIS — R0609 Other forms of dyspnea: Secondary | ICD-10-CM | POA: Insufficient documentation

## 2016-09-20 DIAGNOSIS — R7989 Other specified abnormal findings of blood chemistry: Secondary | ICD-10-CM

## 2016-09-20 DIAGNOSIS — R0789 Other chest pain: Secondary | ICD-10-CM | POA: Diagnosis not present

## 2016-09-20 DIAGNOSIS — R0602 Shortness of breath: Secondary | ICD-10-CM

## 2016-09-20 MED ORDER — IOPAMIDOL (ISOVUE-370) INJECTION 76%
100.0000 mL | Freq: Once | INTRAVENOUS | Status: AC | PRN
  Administered 2016-09-20: 100 mL via INTRAVENOUS

## 2016-09-22 ENCOUNTER — Telehealth: Payer: Self-pay

## 2016-09-22 NOTE — Telephone Encounter (Signed)
SENT NOTES TO SCHEDULING 

## 2016-09-29 ENCOUNTER — Encounter: Payer: Self-pay | Admitting: Physician Assistant

## 2016-10-05 ENCOUNTER — Ambulatory Visit (INDEPENDENT_AMBULATORY_CARE_PROVIDER_SITE_OTHER): Payer: 59 | Admitting: Internal Medicine

## 2016-10-05 ENCOUNTER — Encounter: Payer: Self-pay | Admitting: Internal Medicine

## 2016-10-05 VITALS — BP 114/70 | HR 72 | Ht 63.0 in | Wt 155.8 lb

## 2016-10-05 DIAGNOSIS — R55 Syncope and collapse: Secondary | ICD-10-CM | POA: Diagnosis not present

## 2016-10-05 DIAGNOSIS — R0602 Shortness of breath: Secondary | ICD-10-CM

## 2016-10-05 NOTE — Progress Notes (Signed)
Cardiology Office Note   Date:  10/05/2016   ID:  MALLEY HAUTER, DOB Oct 30, 1965, MRN [id number]  PCP:  Kathyrn Lass, MD  Cardiologist:   Dorris Carnes, MD   Pt referred by Dr Sabra Heck for SOB     History of Present Illness:  Mariah Vaughn is a 51 y.o. female with a history of chest pain, SOB   She was seen by Lendell Caprice remotely  Pain felt to be muscular.  Worse with palpation  Followed by [facility]   Seen on 5/1   For about 8 months has had tingling down L arm  Has also had pains in neck Pt says that with any exertion she gets SOB  Does work at home   OUt of breath  WOrse in past 3 months    [name] in clinic  D dimer +  Went on to have CT scan which was  negatvie    StillSOB  Inhaler not helping   Yesterday she was Doing yard work  Had neck pain  Felt dizzy doing it  Then passed out    Felt heart beat in head then fell out  Pt says she  Has had tachycardia  (150s in past)  HR was  120 yesterday    Dizzy all the time for the past year  Worse recently  Has alos been vomiting     Current Meds  Medication Sig  . albuterol (PROVENTIL HFA;VENTOLIN HFA) 108 (90 Base) MCG/ACT inhaler Inhale 2 puffs into the lungs every 4 (four) hours as needed for wheezing or shortness of breath.  . Fexofenadine HCl (ALLEGRA PO) Take 1 tablet by mouth daily.  . meclizine (ANTIVERT) 12.5 MG tablet Take 1 tablet by mouth every 8 (eight) hours as needed for dizziness.  . Melatonin Gummies 2.5 MG CHEW Chew 1 Dose by mouth at bedtime as needed (sleep).  . Naproxen (NAPROSYN PO) Take 1 tablet by mouth daily as needed. cramps  . NITROSTAT 0.4 MG SL tablet Place 0.4 mg under the tongue every 5 (five) minutes as needed for chest pain.   Marland Kitchen ONDANSETRON HCL PO Take 8 mg by mouth 3 (three) times daily as needed (nausea or vomiting).   . valACYclovir (VALTREX) 1000 MG tablet Take 2,000 mg by mouth as directed. TAKE 2 TABLETS EVERY 12 HRS X 1 DAY PRN FOR OUTBREAK      Allergies:   Amoxicillin; Cefaclor;  Codeine; Doxycycline; Flonase [fluticasone propionate]; Keflex [cephalexin]; Moviprep [peg-kcl-nacl-nasulf-na asc-c]; Penicillins; Sulfur; and Tamiflu [oseltamivir phosphate]   Past Medical History:  Diagnosis Date  . Allergic rhinitis   . Anxiety   . Asthma   . Bipolar 2 disorder ([place])   . Bulging lumbar disc    C SPINE  . Depression   . Gastritis    GANEM  . History of palpitations    HX OF PAC'S AND HX OF PVC'S  . Hx of smoking   . Knee pain, right     Past Surgical History:  Procedure Laterality Date  . COLONSCOPY  08/2010  . ESOPHAGOGASTRODUODENOSCOPY  08/2010  . ESOPHAGOGASTRODUODENOSCOPY  12/2014  . SEPTOPLASTY  08/2010  . TURBINATE REDUCTION  08/2010  . WISDOM TOOTH EXTRACTION     AGE 89     Social History:  The patient  reports that she quit smoking about 13 years ago. Her smoking use included Cigarettes. She has a 37.50 pack-year smoking history. She has never used smokeless tobacco. She reports that she drinks alcohol.  She reports that she does not use drugs.   Family History:  The patient's family history includes ADD / ADHD in her son; Atrial fibrillation in her mother; Autism in her son; Breast cancer (age of onset: 39) in her mother; Lung cancer in her maternal grandmother; Other in her father; Ovarian cancer (age of onset: 24) in her sister; Pancreatitis in her father; Pneumonia in her father; Prostate cancer (age of onset: 25) in her paternal grandfather; Stroke in her father.    ROS:  Please see the history of present illness. All other systems are reviewed and  Negative to the above problem except as noted.    PHYSICAL EXAM: VS:  BP 114/70   Pulse 72   Ht 5\' 3"  (1.6 m)   Wt 155 lb 12.8 oz (70.7 kg)   SpO2 99%   BMI 27.60 kg/m   GEN: Well nourished, well developed, in no acute distress  HEENT: normal  Neck: no JVD, carotid bruits, or masses Cardiac: RRR; no murmurs, rubs, or gallops,no edema  Respiratory:  clear to auscultation bilaterally, normal  work of breathing GI: soft, nontender, nondistended, + BS  No hepatomegaly  MS: no deformity Moving all extremities   Skin: warm and dry, no rash Neuro:  Strength and sensation are intact Psych: euthymic mood, full affect   EKG:  EKG is  ordered today.  SR 73     Lipid Panel No results found for: CHOL, TRIG, HDL, CHOLHDL, VLDL, LDLCALC, LDLDIRECT    Wt Readings from Last 3 Encounters:  10/05/16 155 lb 12.8 oz (70.7 kg)  05/09/13 135 lb (61.2 kg)      ASSESSMENT AND PLAN:  1  Dypsnea.  Exam without evid of volume overload  CT did not comment on signif calcifications of coronaries   WIll get labs from primeary MD  I would also recomm an echo ot evla LV systolic /diastolic function  Further testing based on test results  2  Syncope  Pt was dizzy earlier while working in yard.  Sounds orthostatic  Will review echo   Current medicines are reviewed at length with the patient today.  The patient does not have concerns regarding medicines.  Signed, Dorris Carnes, MD  10/05/2016 3:57 PM    [address], [place]  [address] Phone: [phone]; Fax: [phone]

## 2016-10-05 NOTE — Patient Instructions (Signed)

## 2016-10-07 ENCOUNTER — Ambulatory Visit (INDEPENDENT_AMBULATORY_CARE_PROVIDER_SITE_OTHER): Payer: 59 | Admitting: Internal Medicine

## 2016-10-07 ENCOUNTER — Encounter: Payer: Self-pay | Admitting: *Deleted

## 2016-10-07 ENCOUNTER — Encounter: Payer: Self-pay | Admitting: Internal Medicine

## 2016-10-07 ENCOUNTER — Other Ambulatory Visit (INDEPENDENT_AMBULATORY_CARE_PROVIDER_SITE_OTHER): Payer: 59

## 2016-10-07 VITALS — BP 110/66 | HR 91 | Ht 63.0 in | Wt 158.0 lb

## 2016-10-07 DIAGNOSIS — R0609 Other forms of dyspnea: Secondary | ICD-10-CM | POA: Diagnosis not present

## 2016-10-07 DIAGNOSIS — R0600 Dyspnea, unspecified: Secondary | ICD-10-CM

## 2016-10-07 LAB — BASIC METABOLIC PANEL
BUN: 12 mg/dL (ref 6–23)
CO2: 30 meq/L (ref 19–32)
Calcium: 9.2 mg/dL (ref 8.4–10.5)
Chloride: 106 mEq/L (ref 96–112)
Creatinine, Ser: 0.75 mg/dL (ref 0.40–1.20)
GFR: 87.41 mL/min (ref >60.00)
GLUCOSE: 102 mg/dL — AB (ref 70–99)
POTASSIUM: 4.2 meq/L (ref 3.5–5.1)
SODIUM: 141 meq/L (ref 135–145)

## 2016-10-07 LAB — BRAIN NATRIURETIC PEPTIDE: PRO B NATRI PEPTIDE: 24 pg/mL (ref 0.0–100.0)

## 2016-10-07 MED ORDER — FAMOTIDINE 20 MG PO TABS: ORAL_TABLET | ORAL | 11 refills | Status: DC

## 2016-10-07 MED ORDER — PANTOPRAZOLE SODIUM 40 MG PO TBEC: 40.0000 mg | DELAYED_RELEASE_TABLET | Freq: Every day | ORAL | 2 refills | Status: DC

## 2016-10-07 NOTE — Progress Notes (Signed)
Spoke with pt and notified of results per Dr. Wert. Pt verbalized understanding and denied any questions. 

## 2016-10-07 NOTE — Patient Instructions (Addendum)
Pantoprazole (protonix) 40 mg   Take  30-60 min before first meal of the day and Pepcid (famotidine)  20 mg one @  bedtime until return to office - this is the best way to tell whether stomach acid is contributing to your problem.   GERD (REFLUX)  is an extremely common cause of respiratory symptoms just like yours , many times with no obvious heartburn at all.    It can be treated with medication, but also with lifestyle changes including elevation of the head of your bed (ideally with 6 inch  bed blocks),  Smoking cessation, avoidance of late meals, excessive alcohol, and avoid fatty foods, chocolate, peppermint, colas, red wine, and acidic juices such as orange juice.  NO MINT OR MENTHOL PRODUCTS SO NO COUGH DROPS   USE SUGARLESS CANDY INSTEAD (Jolley ranchers or Stover's or Life Savers) or even ice chips will also do - the key is to swallow to prevent all throat clearing. NO OIL BASED VITAMINS - use powdered substitutes.    Please remember to go to the lab   department downstairs in the basement  for your tests - we will call you with the results when they are available.   To get the most out of exercise, you need to be continuously aware that you are short of breath, but never out of breath, for 30 minutes daily. As you improve, it will actually be easier for you to do the same amount of exercise  in  30 minutes so always push to the level where you are short of breath.      Please schedule a follow up office visit in 6 weeks, call sooner if needed

## 2016-10-07 NOTE — Progress Notes (Signed)
Subjective:     Patient ID: Mariah Vaughn, female   DOB: 1965/12/12, 51 y.o.   MRN: [id number] HPI  62 yowf works as Copywriter, advertising and started smoking age 40 then developed recurrent bronchitis pattern in teens / lots of breathng meds/ theoph but much better  once quit in 2006 but still would need albuterol with exp to fragrances /dust / mold eval by Shaune Leeks since age 64 with last eval 2014 and dx asthma and cat allergy and not since then rarely needing albuterol at all ok doing aerobics / ok sleeping but since then indolent onset fall 2017 progressively worse doe so referred to pulmonary clinic 10/07/2016 by Dr   Kathyrn Lass p Pos D dimer > CTa 09/20/16 neg PE/ ILD     10/07/2016 1st [facility] office visit/ Wert   Chief Complaint  Patient presents with  . Pulmonary Consult    Referred by Dr. Kathyrn Lass. Pt states she has hx of Asthma and Chronic Bronchitis. She c/o increased SOB for the past 8 months. She states she gets winded with exertion such as housework.  She states she had elevated D Dimer 2 wks ago with neg CTA. No dopplers were done.    indolent onset progressive doe / chest tightness / no better with saba  MMRC1 = can walk nl pace, flat grade, can't hurry or go uphills or steps s sob   No assoc cough/ wheeze/ noct symptoms    No obvious day to day or daytime variability or assoc excess/ purulent sputum or mucus plugs or hemoptysis or cp or chest tightness, subjective wheeze or overt sinus or hb symptoms. No unusual exp hx or h/o childhood pna  or knowledge of premature birth.  Sleeping ok without nocturnal  or early am exacerbation  of respiratory  c/o's or need for noct saba. Also denies any obvious fluctuation of symptoms with weather or environmental changes or other aggravating or alleviating factors except as outlined above   Current Medications, Allergies, Complete Past Medical History, Past Surgical History, Family History, and Social History were reviewed in  [employer] record.  ROS  The following are not active complaints unless bolded sore throat, dysphagia, dental problems, itching, sneezing,  nasal congestion or excess/ purulent secretions, ear ache,   fever, chills, sweats, unintended wt loss, classically pleuritic or exertional cp,  orthopnea pnd or leg swelling, presyncope, palpitations, abdominal pain, anorexia, nausea, vomiting, diarrhea  or change in bowel or bladder habits, change in stools or urine, dysuria,hematuria,  rash, arthralgias, visual complaints, headache, numbness, weakness or ataxia or problems with walking or coordination,  change in mood/anxious and depressed affect or memory.              Review of Systems     Objective:   Physical Exam    amb wf nad talking a mile a minute   Wt Readings from Last 3 Encounters:  10/07/16 158 lb (71.7 kg)  10/05/16 155 lb 12.8 oz (70.7 kg)  05/09/13 135 lb (61.2 kg)    Vital signs reviewed    HEENT: nl dentition, turbinates bilaterally, and oropharynx. Nl external ear canals without cough reflex   NECK :  without JVD/Nodes/TM/ nl carotid upstrokes bilaterally   LUNGS: no acc muscle use,  Nl contour chest which is clear to A and P bilaterally without cough on insp or exp maneuvers   CV:  RRR  no s3 or murmur or increase in P2, and no edema  ABD:  soft and nontender with nl inspiratory excursion in the supine position. No bruits or organomegaly appreciated, bowel sounds nl  MS:  Nl gait/ ext warm without deformities, calf tenderness, cyanosis or clubbing No obvious joint restrictions   SKIN: warm and dry without lesions    NEURO:  alert, approp, nl sensorium with  no motor or cerebellar deficits apparent.     I personally reviewed images and agree with radiology impression as follows:  CTa  Chest  09/20/16 No definite evidence of pulmonary embolus. No acute cardiopulmonary abnormality seen.    Labs reviewed from 09/20/16 TSH/ cbc nl  D  dimer 0.60   Labs ordered/ reviewed:      Chemistry      Component Value Date/Time   NA 141 10/07/2016 1556   K 4.2 10/07/2016 1556   CL 106 10/07/2016 1556   CO2 30 10/07/2016 1556   BUN 12 10/07/2016 1556   CREATININE 0.75 10/07/2016 1556      Component Value Date/Time   CALCIUM 9.2 10/07/2016 1556   ALT 14 06/22/2012 1645           Lab Results  Component Value Date   PROBNP 24.0 10/07/2016           Assessment:

## 2016-10-08 NOTE — Assessment & Plan Note (Addendum)
Spirometry 10/07/2016  wnl with nl curvature  s rx prior - 10/07/2016  Walked RA x 3 laps @ 185 ft each stopped due to  End of study, fast pace, no desat but at end so  Symptoms are markedly disproportionate to objective findings and not clear this is a lung problem but pt does appear to have difficult airway management issues. DDX of  difficult airways management almost all start with A and  include Adherence, Ace Inhibitors, Acid Reflux, Active Sinus Disease, Alpha 1 Antitripsin deficiency, Anxiety masquerading as Airways dz,  ABPA,  Allergy(esp in young), Aspiration (esp in elderly), Adverse effects of meds,  Active smokers, A bunch of PE's (a small clot burden can't cause this syndrome unless there is already severe underlying pulm or vascular dz with poor reserve) plus two Bs  = Bronchiectasis and Beta blocker use..and one C= CHF   Adherence is always the initial "prime suspect" and is a multilayered concern that requires a "trust but verify" approach in every patient - starting with knowing how to use medications, especially inhalers, correctly, keeping up with refills and understanding the fundamental difference between maintenance and prns vs those medications only taken for a very short course and then stopped and not refilled.   ? Acid (or non-acid) GERD > always difficult to exclude as up to 75% of pts in some series report no assoc GI/ Heartburn symptoms> rec max (24h)  acid suppression and diet restrictions/ reviewed and instructions given in writing.   ? Anxiety > usually at the bottom of this list of usual suspects but should be much higher on this pt's based on H and P and note previously  on psychotropics> Follow up per Primary Care planned      ? Allergy/asthma > unlikely despite allergy hx per [place] as no assoc rhinitis/ cough with this flare which is perfectly proportionate /reproducible with ex and no better with saba   ? A bunch of PE's > very unlikely based on today's walk  ?  chf > very unlikely with bnp so low and has echo ordered to r/o diastolic dysfunction or PAH - cards to follow as well (Dr Harrington Challenger)    I strongly suspect this is functional/ related to deconditioning with 23 lb of wt gain from baseline despite nl tsh > reconditioning reviewed see avs for instructions unique to this ov   Total time devoted to counseling  > 50 % of initial 60 min office visit:  review case with pt/ discussion of options/alternatives/ personally creating written customized instructions  in presence of pt  then going over those specific  Instructions directly with the pt including how to use all of the meds but in particular covering each new medication in detail and the difference between the maintenance= "automatic" meds and the prns using an action plan format for the latter (If this problem/symptom => do that organization reading Left to right).  Please see AVS from this visit for a full list of these instructions which I personally wrote for this pt and  are unique to this visit.

## 2016-10-10 LAB — RESPIRATORY ALLERGY PROFILE REGION II ~~LOC~~
Allergen, C. Herbarum, M2: <0.1 kU/L
Allergen, Comm Silver Birch, t9: <0.1 kU/L
Allergen, Cottonwood, t14: <0.1 kU/L
Allergen, D pternoyssinus,d7: 26.3 kU/L — ABNORMAL HIGH
Allergen, Mulberry, t76: <0.1 kU/L
Aspergillus fumigatus, m3: <0.1 kU/L
CAT DANDER: 12.5 kU/L — AB
Cockroach: <0.1 kU/L
Common Ragweed: 0.58 kU/L — ABNORMAL HIGH
D. farinae: 22 kU/L — ABNORMAL HIGH
Dog Dander: 8.62 kU/L — ABNORMAL HIGH
IGE (IMMUNOGLOBULIN E), SERUM: 343 kU/L — AB (ref <115)
Johnson Grass: <0.1 kU/L
Pecan/Hickory Tree IgE: <0.1 kU/L
Rough Pigweed  IgE: <0.1 kU/L

## 2016-10-11 ENCOUNTER — Ambulatory Visit: Payer: 59 | Admitting: Physician Assistant

## 2016-10-11 NOTE — Progress Notes (Signed)
Spoke with pt and notified of results per Dr. Wert. Pt verbalized understanding and denied any questions. 

## 2016-10-18 ENCOUNTER — Ambulatory Visit (HOSPITAL_COMMUNITY): Payer: 59 | Attending: Cardiovascular Disease

## 2016-10-18 ENCOUNTER — Other Ambulatory Visit: Payer: Self-pay

## 2016-10-18 DIAGNOSIS — R0602 Shortness of breath: Secondary | ICD-10-CM | POA: Diagnosis not present

## 2016-10-18 DIAGNOSIS — R0600 Dyspnea, unspecified: Secondary | ICD-10-CM | POA: Diagnosis not present

## 2016-10-18 DIAGNOSIS — T50905A Adverse effect of unspecified drugs, medicaments and biological substances, initial encounter: Secondary | ICD-10-CM | POA: Diagnosis not present

## 2016-10-18 LAB — ECHOCARDIOGRAM COMPLETE
AOASC: 26 cm
AVLVOTPG: 4 mmHg
CHL CUP DOP CALC LVOT VTI: 19.3 cm
CHL CUP STROKE VOLUME: 33 mL
E/e' ratio: 7.42
EWDT: 232 ms
FS: 37 % (ref 28–44)
IV/PV OW: 1.16
LA diam end sys: 29 mm
LA diam index: 1.66 cm/m2
LA vol A4C: 22 ml
LA vol index: 16.6 mL/m2
LASIZE: 29 mm
LAVOL: 29 mL
LDCA: 2.54 cm2
LV E/e' medial: 7.42
LV E/e'average: 7.42
LV TDI E'LATERAL: 12.5
LVDIAVOL: 56 mL (ref 46–106)
LVDIAVOLIN: 32 mL/m2
LVELAT: 12.5 cm/s
LVOT SV: 49 mL
LVOTD: 18 mm
LVOTPV: 97.8 cm/s
LVSYSVOL: 23 mL (ref 14–42)
LVSYSVOLIN: 13 mL/m2
MV Dec: 232
MV Peak grad: 3 mmHg
MV pk E vel: 92.8 m/s
MVPKAVEL: 75 m/s
PW: 8.37 mm — AB (ref 0.6–1.1)
Reg peak vel: 204 cm/s
S' Lateral: 13.1 cm/s
Simpson's disk: 59
TDI e' medial: 10.8
TR max vel: 204 cm/s

## 2016-10-20 ENCOUNTER — Telehealth: Payer: Self-pay | Admitting: *Deleted

## 2016-10-20 DIAGNOSIS — R0602 Shortness of breath: Secondary | ICD-10-CM

## 2016-10-20 NOTE — Telephone Encounter (Signed)
-----   Message from Fay Records, MD sent at 10/18/2016  2:18 PM EDT ----- Echo shows normal pumping and relaxing function of the heart. Would set up for cardiopulmonary stress test to evaluate pulmonary and cardiac function

## 2016-10-20 NOTE — Telephone Encounter (Signed)
Reviewed with patient.  She is aware [facility] will contact her to schedule CPX.  Advised it will be done at [facility] and to refrain from caffeine, tobacco, heavy meal 2 hours prior.  She requested to review medications and states the only things she is currently taking are melatonin and albuterol, uses valtrex prn.  Her medication list has been updated.

## 2016-11-17 ENCOUNTER — Ambulatory Visit: Payer: 59 | Admitting: Internal Medicine

## 2016-11-17 DIAGNOSIS — T50905A Adverse effect of unspecified drugs, medicaments and biological substances, initial encounter: Secondary | ICD-10-CM | POA: Diagnosis not present

## 2016-12-08 DIAGNOSIS — T50905A Adverse effect of unspecified drugs, medicaments and biological substances, initial encounter: Secondary | ICD-10-CM | POA: Diagnosis not present

## 2016-12-13 ENCOUNTER — Ambulatory Visit: Payer: 59 | Admitting: Internal Medicine

## 2016-12-23 DIAGNOSIS — Z124 Encounter for screening for malignant neoplasm of cervix: Secondary | ICD-10-CM | POA: Diagnosis not present

## 2016-12-23 DIAGNOSIS — Z01419 Encounter for gynecological examination (general) (routine) without abnormal findings: Secondary | ICD-10-CM | POA: Diagnosis not present

## 2016-12-26 ENCOUNTER — Other Ambulatory Visit: Payer: Self-pay | Admitting: Obstetrics and Gynecology

## 2016-12-26 DIAGNOSIS — Z1231 Encounter for screening mammogram for malignant neoplasm of breast: Secondary | ICD-10-CM

## 2017-01-05 DIAGNOSIS — R0989 Other specified symptoms and signs involving the circulatory and respiratory systems: Secondary | ICD-10-CM | POA: Diagnosis not present

## 2017-02-14 DIAGNOSIS — S80861A Insect bite (nonvenomous), right lower leg, initial encounter: Secondary | ICD-10-CM | POA: Diagnosis not present

## 2017-02-14 DIAGNOSIS — S80862A Insect bite (nonvenomous), left lower leg, initial encounter: Secondary | ICD-10-CM | POA: Diagnosis not present

## 2017-03-03 ENCOUNTER — Ambulatory Visit
Admission: RE | Admit: 2017-03-03 | Discharge: 2017-03-03 | Disposition: A | Discharge status: Home / Self Care | Payer: 59 | Source: Ambulatory Visit | Attending: Obstetrics and Gynecology | Admitting: Obstetrics and Gynecology

## 2017-03-03 DIAGNOSIS — Z1231 Encounter for screening mammogram for malignant neoplasm of breast: Secondary | ICD-10-CM | POA: Diagnosis not present

## 2017-03-08 DIAGNOSIS — J309 Allergic rhinitis, unspecified: Secondary | ICD-10-CM | POA: Diagnosis not present

## 2017-03-08 DIAGNOSIS — B349 Viral infection, unspecified: Secondary | ICD-10-CM | POA: Diagnosis not present

## 2017-03-08 DIAGNOSIS — J4520 Mild intermittent asthma, uncomplicated: Secondary | ICD-10-CM | POA: Diagnosis not present

## 2017-05-08 ENCOUNTER — Telehealth: Payer: Self-pay | Admitting: Internal Medicine

## 2017-05-08 DIAGNOSIS — R0600 Dyspnea, unspecified: Secondary | ICD-10-CM

## 2017-05-08 DIAGNOSIS — R0609 Other forms of dyspnea: Principal | ICD-10-CM

## 2017-05-08 NOTE — Telephone Encounter (Signed)
Spoke with pt and she states that she has called a couple of times trying to schedule her CPX and she has been told that order was no longer available.  This was ordered back in May 2018.  Pt states she's still having the SOB.  Advised I would send message to Dr. Harrington Challenger to see if she wants [place] to go ahead and reorder this?

## 2017-05-08 NOTE — Telephone Encounter (Signed)
Patient contacted [facility] office  It does not appear that she started protonix I would recomm that she try this Also, sched for regular treadmill test to eval BP and HR response and symptoms with exercise

## 2017-05-08 NOTE — Telephone Encounter (Signed)
Patient asking [place] to call her after 1:00 pm.

## 2017-05-08 NOTE — Telephone Encounter (Signed)
New message   Pt verbalized that she want to schedule a CARDIOPULMONARY EXERCISE TEST  At [place] but she is having issues getting it scheduled please call pt

## 2017-05-08 NOTE — Telephone Encounter (Signed)
Called pt back, states that she is not having any current breathing complaints and will see her cardiologist before calling for a rov with our office.  Will close encounter.

## 2017-05-09 DIAGNOSIS — R1032 Left lower quadrant pain: Secondary | ICD-10-CM | POA: Diagnosis not present

## 2017-05-09 DIAGNOSIS — R140 Abdominal distension (gaseous): Secondary | ICD-10-CM | POA: Diagnosis not present

## 2017-05-09 NOTE — Telephone Encounter (Signed)
lmtcb

## 2017-05-11 NOTE — Telephone Encounter (Signed)
Talked to patient. She is not taking Protonix.  Also not taking pepcid.--not having any issues. She saw her [facility].  She told him about the Protonix.  He recommends she only take a probiotic and follow up in 4 weeks.  Hoping to get GXT prior to that follow up.  He told her she doesn't have reflux issues.   She has tachycardia (110) with little to no exertion. Resting HR is 70-80s.  Spent 20 min in conversation with this patient.  She reports numbness continuous for days to left arm and right shoulder pain-back and to clavicle that also goes on for days a time.  Has broken this shoulder in past.  She does not know how it happened.  Took Aleve and it helped but she started to worry it would hurt her stomach so stopped this.  She has seen cardiologist in past but doesn't remember who, thinks with [facility] cardiology, she will call them to request any records be sent to Dr. Harrington Challenger.  Took medication for "years" for tachycardia.  Also, sees asthma and allergy doctor since 51 years old and so does not wish to see Dr. Melvyn Novas for pulmonary

## 2017-05-22 ENCOUNTER — Telehealth: Payer: Self-pay | Admitting: Internal Medicine

## 2017-05-22 NOTE — Telephone Encounter (Signed)
Faxed [facility] letterhead over to obtain patients old records of Dr. Irish Lack.   I spoke with patient after she left a vm over the holidays and made her aware I was working in getting the records patient understood and thanked me for the call back.

## 2017-05-25 ENCOUNTER — Ambulatory Visit (INDEPENDENT_AMBULATORY_CARE_PROVIDER_SITE_OTHER): Payer: 59

## 2017-05-25 DIAGNOSIS — R0609 Other forms of dyspnea: Secondary | ICD-10-CM | POA: Diagnosis not present

## 2017-05-25 DIAGNOSIS — R0600 Dyspnea, unspecified: Secondary | ICD-10-CM

## 2017-05-25 LAB — EXERCISE TOLERANCE TEST
CSEPED: 8 min
CSEPEDS: 0 s
CSEPHR: 94 %
Estimated workload: 10.1 METS
MPHR: 171 {beats}/min
Peak HR: 162 {beats}/min
RPE: 15
Rest HR: 74 {beats}/min

## 2017-06-08 DIAGNOSIS — T50905A Adverse effect of unspecified drugs, medicaments and biological substances, initial encounter: Secondary | ICD-10-CM | POA: Diagnosis not present

## 2017-06-28 DIAGNOSIS — R1031 Right lower quadrant pain: Secondary | ICD-10-CM | POA: Diagnosis not present

## 2017-06-29 DIAGNOSIS — N390 Urinary tract infection, site not specified: Secondary | ICD-10-CM | POA: Diagnosis not present

## 2017-06-29 DIAGNOSIS — R1031 Right lower quadrant pain: Secondary | ICD-10-CM | POA: Diagnosis not present

## 2017-07-18 DIAGNOSIS — T50905A Adverse effect of unspecified drugs, medicaments and biological substances, initial encounter: Secondary | ICD-10-CM | POA: Diagnosis not present

## 2017-07-26 DIAGNOSIS — T50905A Adverse effect of unspecified drugs, medicaments and biological substances, initial encounter: Secondary | ICD-10-CM | POA: Diagnosis not present

## 2017-08-08 DIAGNOSIS — T50905A Adverse effect of unspecified drugs, medicaments and biological substances, initial encounter: Secondary | ICD-10-CM | POA: Diagnosis not present

## 2017-11-06 DIAGNOSIS — Z79899 Other long term (current) drug therapy: Secondary | ICD-10-CM | POA: Diagnosis not present

## 2017-12-01 DIAGNOSIS — M542 Cervicalgia: Secondary | ICD-10-CM | POA: Diagnosis not present

## 2017-12-12 DIAGNOSIS — M9903 Segmental and somatic dysfunction of lumbar region: Secondary | ICD-10-CM | POA: Diagnosis not present

## 2017-12-12 DIAGNOSIS — M50123 Cervical disc disorder at C6-C7 level with radiculopathy: Secondary | ICD-10-CM | POA: Diagnosis not present

## 2017-12-12 DIAGNOSIS — M9901 Segmental and somatic dysfunction of cervical region: Secondary | ICD-10-CM | POA: Diagnosis not present

## 2017-12-28 DIAGNOSIS — Q120 Congenital cataract: Secondary | ICD-10-CM | POA: Diagnosis not present

## 2018-02-23 ENCOUNTER — Other Ambulatory Visit: Payer: Self-pay | Admitting: Obstetrics and Gynecology

## 2018-02-23 DIAGNOSIS — Z1231 Encounter for screening mammogram for malignant neoplasm of breast: Secondary | ICD-10-CM

## 2018-03-12 DIAGNOSIS — Z0000 Encounter for general adult medical examination without abnormal findings: Secondary | ICD-10-CM | POA: Diagnosis not present

## 2018-03-12 DIAGNOSIS — R110 Nausea: Secondary | ICD-10-CM | POA: Diagnosis not present

## 2018-03-12 DIAGNOSIS — Z1211 Encounter for screening for malignant neoplasm of colon: Secondary | ICD-10-CM | POA: Diagnosis not present

## 2018-03-30 ENCOUNTER — Ambulatory Visit: Payer: 59

## 2018-04-12 DIAGNOSIS — Z803 Family history of malignant neoplasm of breast: Secondary | ICD-10-CM | POA: Diagnosis not present

## 2018-04-12 DIAGNOSIS — Z8049 Family history of malignant neoplasm of other genital organs: Secondary | ICD-10-CM | POA: Diagnosis not present

## 2018-04-12 DIAGNOSIS — Z01419 Encounter for gynecological examination (general) (routine) without abnormal findings: Secondary | ICD-10-CM | POA: Diagnosis not present

## 2018-04-26 DIAGNOSIS — T50905A Adverse effect of unspecified drugs, medicaments and biological substances, initial encounter: Secondary | ICD-10-CM | POA: Diagnosis not present

## 2018-05-04 ENCOUNTER — Ambulatory Visit: Payer: 59

## 2018-05-22 ENCOUNTER — Ambulatory Visit
Admission: RE | Admit: 2018-05-22 | Discharge: 2018-05-22 | Disposition: A | Discharge status: Home / Self Care | Payer: 59 | Source: Ambulatory Visit | Attending: Obstetrics and Gynecology | Admitting: Obstetrics and Gynecology

## 2018-05-22 DIAGNOSIS — Z1231 Encounter for screening mammogram for malignant neoplasm of breast: Secondary | ICD-10-CM | POA: Diagnosis not present

## 2018-05-24 ENCOUNTER — Other Ambulatory Visit: Payer: Self-pay | Admitting: Obstetrics and Gynecology

## 2018-05-24 DIAGNOSIS — R928 Other abnormal and inconclusive findings on diagnostic imaging of breast: Secondary | ICD-10-CM

## 2018-05-29 ENCOUNTER — Ambulatory Visit
Admission: RE | Admit: 2018-05-29 | Discharge: 2018-05-29 | Disposition: A | Discharge status: Home / Self Care | Payer: 59 | Source: Ambulatory Visit | Attending: Obstetrics and Gynecology | Admitting: Obstetrics and Gynecology

## 2018-05-29 DIAGNOSIS — N6012 Diffuse cystic mastopathy of left breast: Secondary | ICD-10-CM | POA: Diagnosis not present

## 2018-05-29 DIAGNOSIS — R928 Other abnormal and inconclusive findings on diagnostic imaging of breast: Secondary | ICD-10-CM

## 2018-05-29 DIAGNOSIS — R922 Inconclusive mammogram: Secondary | ICD-10-CM | POA: Diagnosis not present

## 2018-06-29 DIAGNOSIS — J069 Acute upper respiratory infection, unspecified: Secondary | ICD-10-CM | POA: Diagnosis not present

## 2018-06-29 DIAGNOSIS — R6889 Other general symptoms and signs: Secondary | ICD-10-CM | POA: Diagnosis not present

## 2018-07-05 DIAGNOSIS — J209 Acute bronchitis, unspecified: Secondary | ICD-10-CM | POA: Diagnosis not present

## 2018-07-05 DIAGNOSIS — L719 Rosacea, unspecified: Secondary | ICD-10-CM | POA: Diagnosis not present

## 2018-07-05 DIAGNOSIS — L0292 Furuncle, unspecified: Secondary | ICD-10-CM | POA: Diagnosis not present

## 2018-08-06 DIAGNOSIS — M25512 Pain in left shoulder: Secondary | ICD-10-CM | POA: Diagnosis not present

## 2018-08-30 DIAGNOSIS — T50905A Adverse effect of unspecified drugs, medicaments and biological substances, initial encounter: Secondary | ICD-10-CM | POA: Diagnosis not present

## 2018-10-01 DIAGNOSIS — L719 Rosacea, unspecified: Secondary | ICD-10-CM | POA: Diagnosis not present

## 2018-10-01 DIAGNOSIS — L816 Other disorders of diminished melanin formation: Secondary | ICD-10-CM | POA: Diagnosis not present

## 2018-10-01 DIAGNOSIS — D1730 Benign lipomatous neoplasm of skin and subcutaneous tissue of unspecified sites: Secondary | ICD-10-CM | POA: Diagnosis not present

## 2018-10-02 DIAGNOSIS — T50905A Adverse effect of unspecified drugs, medicaments and biological substances, initial encounter: Secondary | ICD-10-CM | POA: Diagnosis not present

## 2019-07-12 ENCOUNTER — Institutional Professional Consult (permissible substitution): Payer: 59 | Admitting: Plastic Surgery

## 2019-07-25 ENCOUNTER — Ambulatory Visit
Admission: RE | Admit: 2019-07-25 | Discharge: 2019-07-25 | Disposition: A | Discharge status: Home / Self Care | Payer: 59 | Source: Ambulatory Visit | Attending: Obstetrics and Gynecology | Admitting: Obstetrics and Gynecology

## 2019-07-25 ENCOUNTER — Other Ambulatory Visit: Payer: Self-pay | Admitting: Obstetrics and Gynecology

## 2019-07-25 ENCOUNTER — Other Ambulatory Visit: Payer: Self-pay

## 2019-07-25 DIAGNOSIS — Z1231 Encounter for screening mammogram for malignant neoplasm of breast: Secondary | ICD-10-CM

## 2019-07-26 ENCOUNTER — Ambulatory Visit: Payer: 59 | Admitting: Plastic Surgery

## 2019-07-26 ENCOUNTER — Encounter: Payer: Self-pay | Admitting: Plastic Surgery

## 2019-07-26 DIAGNOSIS — M546 Pain in thoracic spine: Secondary | ICD-10-CM

## 2019-07-26 DIAGNOSIS — G8929 Other chronic pain: Secondary | ICD-10-CM | POA: Diagnosis not present

## 2019-07-26 DIAGNOSIS — M542 Cervicalgia: Secondary | ICD-10-CM | POA: Diagnosis not present

## 2019-07-26 DIAGNOSIS — N62 Hypertrophy of breast: Secondary | ICD-10-CM | POA: Insufficient documentation

## 2019-07-26 DIAGNOSIS — M549 Dorsalgia, unspecified: Secondary | ICD-10-CM | POA: Insufficient documentation

## 2019-07-26 NOTE — Progress Notes (Signed)
Patient ID: Mariah Vaughn, female    DOB: Dec 30, 1965, 54 y.o.   MRN: [id number]   Chief Complaint  Patient presents with  . Advice Only    for (B) breast reduction    Mammary Hyperplasia: The patient is a 54 y.o. female with a history of mammary hyperplasia for several years.  She has extremely large breasts causing symptoms that include the following: Back pain in the upper and lower back, including neck pain. She pulls or pins her bra straps to provide better lift and relief of the pressure and pain. She notices relief by holding her breast up manually.  Her shoulder straps cause grooves and pain and pressure that requires padding for relief. Pain medication is sometimes required with motrin and tylenol.  Activities that are hindered by enlarged breasts include: exercise and running.  She is not a smoker.  She has not had physical therapy.  Her breasts are extremely large and fairly symmetric.  She has hyperpigmentation of the inframammary area on both sides.  The sternal to nipple distance on the right is 29 cm and the left is 29 cm.  The IMF distance is 14 cm.  She is 5 feet 3 inches tall and weighs 165 pounds.  Preoperative bra size = 36 G cup.  The estimated excess breast tissue to be removed at the time of surgery = 450 grams on the left and 450 grams on the right.  Mammogram history: Yesterday and we are waiting on the results.  She has a family history of breast cancer in her mother.  She was tested and was BRCA negative.  So the negative interest primary for the BRCA testing is very end   Review of Systems  Constitutional: Negative.   HENT: Negative.   Eyes: Negative.   Respiratory: Negative.   Cardiovascular: Negative.   Gastrointestinal: Negative.   Endocrine: Negative.   Genitourinary: Negative.   Musculoskeletal: Positive for back pain and neck pain.  Neurological: Negative.   Hematological: Negative.   Psychiatric/Behavioral: Negative.     Past Medical History:   Diagnosis Date  . Allergic rhinitis   . Anxiety   . Asthma   . Bipolar 2 disorder ([place])   . Bulging lumbar disc    C SPINE  . Depression   . Gastritis    GANEM  . History of palpitations    HX OF PAC'S AND HX OF PVC'S  . Hx of smoking   . Knee pain, right     Past Surgical History:  Procedure Laterality Date  . COLONSCOPY  08/2010  . ESOPHAGOGASTRODUODENOSCOPY  08/2010  . ESOPHAGOGASTRODUODENOSCOPY  12/2014  . SEPTOPLASTY  08/2010  . TURBINATE REDUCTION  08/2010  . WISDOM TOOTH EXTRACTION     AGE 42      Current Outpatient Medications:  .  albuterol (PROVENTIL HFA;VENTOLIN HFA) 108 (90 Base) MCG/ACT inhaler, Inhale 2 puffs into the lungs every 4 (four) hours as needed for wheezing or shortness of breath., Disp: , Rfl:  .  ARIPiprazole (ABILIFY) 5 MG tablet, Take 5 mg by mouth daily., Disp: , Rfl:  .  citalopram (CELEXA) 10 MG tablet, Take as directed., Disp: , Rfl:  .  valACYclovir (VALTREX) 1000 MG tablet, Take 2,000 mg by mouth as directed. TAKE 2 TABLETS EVERY 12 HRS X 1 DAY PRN FOR OUTBREAK , Disp: , Rfl:    Objective:   Vitals:   07/26/19 1051  BP: 119/79  Pulse: 85  Temp: 97.8  F (36.6 C)  SpO2: 98%    Physical Exam Vitals and nursing note reviewed.  Constitutional:      Appearance: Normal appearance.  HENT:     Head: Normocephalic and atraumatic.  Eyes:     Extraocular Movements: Extraocular movements intact.  Cardiovascular:     Rate and Rhythm: Normal rate.     Pulses: Normal pulses.  Pulmonary:     Effort: Pulmonary effort is normal.  Abdominal:     General: Abdomen is flat. There is no distension.     Tenderness: There is no abdominal tenderness.  Skin:    General: Skin is warm.     Capillary Refill: Capillary refill takes less than 2 seconds.  Neurological:     General: No focal deficit present.     Mental Status: She is alert and oriented to person, place, and time.  Psychiatric:        Mood and Affect: Mood normal.        Behavior:  Behavior normal.        Thought Content: Thought content normal.     Assessment & Plan:  Neck pain  Chronic bilateral thoracic back pain  Symptomatic mammary hypertrophy  The patient is a good candidate for bilateral breast reduction.  I will go ahead and get her set up with physical therapy.  We will need the results of her mammogram from yesterday.  I have encouraged her to continue with low-carb low sugar diet.  We discussed the risks and complications of the surgery.  These complications reviewed include asymmetry, scarring, change in nipple areola sensation, inability to breast-feed and wound healing complications.  Pictures were obtained of the patient and placed in the chart with the patient's or guardian's permission.  Wallace Going, DO   The 21st Century Cures Act was signed into law in 2016 which includes the topic of electronic health records.  This provides immediate access to information in MyChart.  This includes consultation notes, operative notes, office notes, lab results and pathology reports.  If you have any questions about what you read please let us know at your next visit or call us at the office.  We are right here with you.

## 2019-08-13 ENCOUNTER — Other Ambulatory Visit: Payer: Self-pay

## 2019-08-13 ENCOUNTER — Ambulatory Visit: Payer: 59 | Attending: Plastic Surgery | Admitting: Physical Therapy

## 2019-08-13 ENCOUNTER — Encounter: Payer: Self-pay | Admitting: Physical Therapy

## 2019-08-13 DIAGNOSIS — M542 Cervicalgia: Secondary | ICD-10-CM | POA: Insufficient documentation

## 2019-08-13 DIAGNOSIS — M6281 Muscle weakness (generalized): Secondary | ICD-10-CM

## 2019-08-13 DIAGNOSIS — M546 Pain in thoracic spine: Secondary | ICD-10-CM | POA: Diagnosis present

## 2019-08-13 NOTE — Therapy (Signed)
[place], [place], [address] Phone: [phone]   Fax:  [phone]  Physical Therapy Evaluation  Patient Details  Name: Mariah Vaughn MRN: [id number] Date of Birth: 02-Jan-1966 Referring Provider (PT): Dr. Audelia Hives   Encounter Date: 08/13/2019  PT End of Session - 08/13/19 1308    Visit Number  1    Number of Visits  6    Date for PT Re-Evaluation  09/24/19    PT Start Time  1220    PT Stop Time  1305    PT Time Calculation (min)  45 min    Activity Tolerance  Patient tolerated treatment well    Behavior During Therapy  [facility] for tasks assessed/performed       Past Medical History:  Diagnosis Date  . Allergic rhinitis   . Anxiety   . Asthma   . Bipolar 2 disorder ([place])   . Bulging lumbar disc    C SPINE  . Depression   . Gastritis    GANEM  . History of palpitations    HX OF PAC'S AND HX OF PVC'S  . Hx of smoking   . Knee pain, right     Past Surgical History:  Procedure Laterality Date  . COLONSCOPY  08/2010  . ESOPHAGOGASTRODUODENOSCOPY  08/2010  . ESOPHAGOGASTRODUODENOSCOPY  12/2014  . SEPTOPLASTY  08/2010  . TURBINATE REDUCTION  08/2010  . WISDOM TOOTH EXTRACTION     AGE 19    There were no vitals filed for this visit.   Subjective Assessment - 08/13/19 1225    Subjective  Patient has had increased pain in neck and back for about 4 yrs.  She has gained weight and much of it has been in hr upper body.  She is pursuing surgery for breast reduction and hopes that it may reduce her pain. She has intermittent numbness in hands.  She has had periods of weakness in hands.    Pertinent History  spinal stenosis, Rt shoulder fracture    Limitations  Other (comment);Sitting;Lifting;Reading   exercise, running   Patient Stated Goals  Pt would like to have less pain    Currently in Pain?  Yes    Pain Score  6     Pain Location  Neck    Pain Orientation  Right;Left;Posterior     Pain Descriptors / Indicators  Other (Comment)   triggering   Pain Type  Chronic pain    Aggravating Factors   being upright    Pain Relieving Factors  nothing, taking bra off, supine, heating pad    Effect of Pain on Daily Activities  working out , comfort at rest, enjoying retirement    Multiple Pain Sites  No         [facility] PT Assessment - 08/13/19 0001      Assessment   Medical Diagnosis  neck pain, thoracic     Referring Provider (PT)  Dr. 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      Cognition   Overall Cognitive Status  Within Functional Limits for tasks assessed      Observation/Other Assessments   Focus on Therapeutic Outcomes (FOTO)   39%      Sensation   Light Touch  Appears Intact    Additional Comments  fingers became numb with standing goal post position on wall       Posture/Postural Control   Posture/Postural Control  Postural limitations    Postural Limitations  Rounded Shoulders;Forward head;Increased thoracic kyphosis;Anterior pelvic tilt    Posture Comments  internal rotation bilateral humerus      AROM   Cervical Flexion  30    Cervical Extension  20    Cervical - Right Side Bend  30    Cervical - Left Side Bend  35      Strength   Strength Assessment Site  Shoulder    Right Shoulder Flexion  4+/5   pain    Right Shoulder Extension  4/5    Right Shoulder ABduction  4+/5    Left Shoulder Flexion  4+/5    Left Shoulder Extension  4/5    Left Shoulder ABduction  4+/5      Palpation   Spinal mobility  hypomobile thoracic spine , not painful     Palpation comment  hypertonic throughout cervicals, upper traps                 Objective measurements completed on examination: See above findings.      [facility] PT Treatment/Exercise - 08/13/19 0001      Shoulder Exercises: Standing   Horizontal ABduction  Strengthening;Both;15 reps    Theraband Level (Shoulder Horizontal ABduction)  Level 3 (Green)    Flexion  Strengthening;Both;15 reps    Other Standing Exercises  goal post x 10              PT Education - 08/13/19 1306    Education Details  PT, HEP, alignment and posture    Person(s) Educated  Patient    Methods  Explanation;Handout    Comprehension  Verbalized understanding;Returned demonstration          PT Long Term Goals - 08/13/19 1310      PT LONG TERM GOAL #1   Title  Pt will be I with HEP for neck and upper back (stability, flexibility)    Time  6    Period  Weeks    Status  New    Target Date  09/24/19      PT LONG TERM GOAL #2   Title  Pt will be able to report increased ability to reach to mid back with Rt UE and overhead    Baseline  tight end range    Time  6    Period  Weeks    Status  New    Target Date  09/24/19      PT LONG TERM GOAL #3   Title  Pt will be able to demo 5/5 strength in bilateral shoulders (extension and horiz abd)    Baseline  4/5    Time  6    Period  Weeks    Status  New    Target Date  09/24/19      PT LONG TERM GOAL #4   Title  Pt will demonstrate good alignment in neck and scapula for overhead lifting with min to no cues.    Time  6  Period  Weeks    Status  New    Target Date  09/24/19             Plan - 08/13/19 1314    Clinical Impression Statement  Pt with low complexity eval of neck and upper back pain due to mammary hypertrophy.  She is already very active but needs work on alignment to improve safety with lifting and fitness.  She has tightness in neck and Rt shoulder, spine stiffness and weakness in posterior chain.  She will not need many visits as she is quite fit already but may not be tuned  in to her body position.    Personal Factors and Comorbidities  Behavior Pattern;Comorbidity 1;Past/Current Experience;Comorbidity 2    Comorbidities  chronic neck pain, anxiety    Examination-Activity Limitations  Carry;Sit;Reach Overhead;Lift    Examination-Participation Restrictions  Community Activity    Stability/Clinical Decision Making  Stable/Uncomplicated    Clinical Decision Making  Low    Rehab Potential  Excellent    PT Frequency  1x / week    PT Duration  6 weeks    PT Treatment/Interventions  ADLs/Self Care Home Management;Therapeutic activities;Patient/family education;Moist Heat;Neuromuscular re-education;Manual techniques;Therapeutic exercise;Functional mobility training;Taping    PT Next Visit Plan  check HEP, manual T spine mobs, Rt scap.  Prone ball ex    PT Home Exercise Plan  wall angel (hold pose), corner stretch, horiz pull and narrow grip flexion    Consulted and Agree with Plan of Care  Patient       Patient will benefit from skilled therapeutic intervention in order to improve the following deficits and impairments:  Increased fascial restricitons, Pain, Impaired sensation, Postural dysfunction, Decreased mobility, Impaired UE functional use, Hypomobility, Decreased strength, Decreased range of motion, Impaired flexibility  Visit Diagnosis: Pain in thoracic spine  Cervicalgia  Muscle weakness (generalized)     Problem List Patient Active Problem List   Diagnosis Date Noted  . Neck pain 07/26/2019  . Back pain 07/26/2019  . Symptomatic mammary hypertrophy 07/26/2019  . DOE (dyspnea on exertion) 10/07/2016  . Nonspecific abnormal unspecified cardiovascular function study 05/10/2013    Lanae Federer 08/13/2019, 2:03 PM  [place], [place], [address] Phone: [phone]   Fax:  [phone]  Name: Ladoris Hilby MRN: [id number] Date of Birth: [birth year]   Raeford Razor,  PT 08/13/19 2:03 PM Phone: [phone] Fax: [phone]

## 2019-08-13 NOTE — Patient Instructions (Signed)
Access Code: [email or web address]: [email or web address]: 03/23/2021Prepared by: Anderson Malta PaaExercises  Corner Pec Major Stretch - 2 x daily - 7 x weekly - 1 sets - 5 reps - 30 hold  Wall Angels - 2 x daily - 7 x weekly - 2 sets - 10 reps - 30 hold  Standing Shoulder Flexion AAROM with Dowel - 2 x daily - 7 x weekly - 2 sets - 10 reps - 5 hold  Seated Shoulder Horizontal Abduction with Resistance - Thumbs Up - 2 x daily - 7 x weekly - 2 sets - 10 reps - 5 hold

## 2019-08-19 ENCOUNTER — Ambulatory Visit: Payer: 59 | Admitting: Physical Therapy

## 2019-08-19 ENCOUNTER — Encounter: Payer: Self-pay | Admitting: Physical Therapy

## 2019-08-19 ENCOUNTER — Other Ambulatory Visit: Payer: Self-pay

## 2019-08-19 DIAGNOSIS — M546 Pain in thoracic spine: Secondary | ICD-10-CM

## 2019-08-19 DIAGNOSIS — M542 Cervicalgia: Secondary | ICD-10-CM

## 2019-08-19 DIAGNOSIS — M6281 Muscle weakness (generalized): Secondary | ICD-10-CM

## 2019-08-20 NOTE — Therapy (Signed)
[place], [place], [address] Phone: [phone]   Fax:  [phone]  Physical Therapy Treatment  Patient Details  Name: Mariah Vaughn MRN: [id number] Date of Birth: 10/01/1965 Referring Provider (PT): Dr. Audelia Hives   Encounter Date: 08/19/2019  PT End of Session - 08/19/19 1551    Visit Number  2    Number of Visits  6    Date for PT Re-Evaluation  09/24/19    PT Start Time  [id number]   Pt. arrived late   PT Stop Time  1626    PT Time Calculation (min)  34 min    Activity Tolerance  Patient tolerated treatment well    Behavior During Therapy  WFL for tasks assessed/performed       Past Medical History:  Diagnosis Date  . Allergic rhinitis   . Anxiety   . Asthma   . Bipolar 2 disorder ([place])   . Bulging lumbar disc    C SPINE  . Depression   . Gastritis    GANEM  . History of palpitations    HX OF PAC'S AND HX OF PVC'S  . Hx of smoking   . Knee pain, right     Past Surgical History:  Procedure Laterality Date  . COLONSCOPY  08/2010  . ESOPHAGOGASTRODUODENOSCOPY  08/2010  . ESOPHAGOGASTRODUODENOSCOPY  12/2014  . SEPTOPLASTY  08/2010  . TURBINATE REDUCTION  08/2010  . WISDOM TOOTH EXTRACTION     AGE 54    There were no vitals filed for this visit.  Subjective Assessment - 08/19/19 1551    Subjective  Patient reports an increase in tension in her back and neck. She indicates that her muscles are spasming and the exercises are increasing her episodes. She reports numbness down into the hand  when her muscles spasm.    Pertinent History  spinal stenosis, Rt shoulder fracture    Limitations  Other (comment);Sitting;Lifting;Reading    How long can you sit comfortably?  10-15 mins    Patient Stated Goals  Pt would like to have less pain    Currently in Pain?  Yes    Pain Score  5     Pain Location  Neck    Pain Orientation  Medial   Traps   Pain Descriptors / Indicators   Spasm;Other (Comment)   Triggering   Pain Type  Chronic pain    Pain Onset  More than a month ago    Pain Frequency  Constant    Aggravating Factors   being upright    Pain Relieving Factors  taking bra off, supine, heat    Effect of Pain on Daily Activities  working out, comfort at rest    Multiple Pain Sites  Yes   Shoulder/ upper thoracic   Pain Score  5         [facility] PT Assessment - 08/20/19 0001      Palpation   Spinal mobility  limited scapular mobility and difficulty distracting manually                   [facility] Adult PT Treatment/Exercise - 08/19/19 0001      Exercises   Exercises  Neck   Chin tucks      Neck Exercises: Seated   Cervical Isometrics  --      Neck Exercises: Supine   Cervical Isometrics  Flexion;Extension;10 secs    Neck Retraction  10 reps;1 rep  Shoulder Exercises: Prone   Other Prone Exercises  qped UE horiz abd to 90-thread the needle    Other Prone Exercises  qped scapular protraction/retraction      Shoulder Exercises: ROM/Strengthening   Other ROM/Strengthening Exercises  Short Arc Neck Extension       Manual Therapy   Manual Therapy  Scapular mobilization;Manual Traction    Scapular Mobilization  sidelying scapular distraction & mobilization    Manual Traction  Cervical Traction in supine       Neck Exercises: Stretches   Upper Trapezius Stretch  1 rep;Right;Left;10 seconds    Levator Stretch  Right;Left;1 rep;10 seconds             PT Education - 08/20/19 0744    Education Details  anatomy of condition, rationale for PT around reduction surgeries.    Person(s) Educated  Patient    Methods  Explanation;Demonstration;Tactile cues;Verbal cues;Handout    Comprehension  Need further instruction;Returned demonstration;Verbal cues required;Tactile cues required          PT Long Term Goals - 08/13/19 1310      PT LONG TERM GOAL #1   Title  Pt will be I with HEP for neck and upper back (stability,  flexibility)    Time  6    Period  Weeks    Status  New    Target Date  09/24/19      PT LONG TERM GOAL #2   Title  Pt will be able to report increased ability to reach to mid back with Rt UE and overhead    Baseline  tight end range    Time  6    Period  Weeks    Status  New    Target Date  09/24/19      PT LONG TERM GOAL #3   Title  Pt will be able to demo 5/5 strength in bilateral shoulders (extension and horiz abd)    Baseline  4/5    Time  6    Period  Weeks    Status  New    Target Date  09/24/19      PT LONG TERM GOAL #4   Title  Pt will demonstrate good alignment in neck and scapula for overhead lifting with min to no cues.    Time  6    Period  Weeks    Status  New    Target Date  09/24/19            Plan - 08/19/19 1739    Clinical Impression Statement  Pt. reports tightness and spasming during the exercises. She has limited ROM in all directions, but responded well to scapular mobilizations. She indicated that she had an increase in  ROM and a decrease in pain after manual techniques were performed. Patient would benefit from PT in order to further address muscle spasming, muscle tightness, and ROM deficits.    Personal Factors and Comorbidities  Behavior Pattern;Comorbidity 1;Past/Current Experience;Comorbidity 2    Comorbidities  chronic neck pain, anxiety    Examination-Activity Limitations  Carry;Sit;Reach Overhead;Lift    Examination-Participation Restrictions  Community Activity    Stability/Clinical Decision Making  Stable/Uncomplicated    Clinical Decision Making  Low    Rehab Potential  Good    PT Frequency  1x / week    PT Duration  6 weeks    PT Treatment/Interventions  ADLs/Self Care Home Management;Therapeutic activities;Patient/family education;Moist Heat;Neuromuscular re-education;Manual techniques;Therapeutic exercise;Functional mobility training;Taping    PT Next  Visit Plan  check HEP, manual T spine mobs, Rt scap.  Prone ball ex    PT Home  Exercise Plan  wall angel (hold pose), corner stretch, horiz pull and narrow grip flexion, mid thoracic stretches, threading the needle, upper trap stretch    Consulted and Agree with Plan of Care  Patient       Patient will benefit from skilled therapeutic intervention in order to improve the following deficits and impairments:  Increased fascial restricitons, Pain, Impaired sensation, Postural dysfunction, Decreased mobility, Impaired UE functional use, Hypomobility, Decreased strength, Decreased range of motion, Impaired flexibility  Visit Diagnosis: Pain in thoracic spine  Cervicalgia  Muscle weakness (generalized)     Problem List Patient Active Problem List   Diagnosis Date Noted  . Neck pain 07/26/2019  . Back pain 07/26/2019  . Symptomatic mammary hypertrophy 07/26/2019  . DOE (dyspnea on exertion) 10/07/2016  . Nonspecific abnormal unspecified cardiovascular function study 05/10/2013   Laveda Norman, SPT Josepha Barbier C. Dwayna Kentner PT, DPT 08/20/19 7:49 AM  During this treatment session, the therapist was present, participating in and directing the treatment.  [place], [place], [address] Phone: [phone]   Fax:  [phone]  Name: Mariah Vaughn MRN: [id number] Date of Birth: 12/28/1965

## 2019-09-02 ENCOUNTER — Encounter: Payer: Self-pay | Admitting: Physical Therapy

## 2019-09-02 ENCOUNTER — Other Ambulatory Visit: Payer: Self-pay

## 2019-09-02 ENCOUNTER — Ambulatory Visit: Payer: 59 | Attending: Plastic Surgery | Admitting: Physical Therapy

## 2019-09-02 DIAGNOSIS — M542 Cervicalgia: Secondary | ICD-10-CM

## 2019-09-02 DIAGNOSIS — M546 Pain in thoracic spine: Secondary | ICD-10-CM

## 2019-09-02 DIAGNOSIS — M6281 Muscle weakness (generalized): Secondary | ICD-10-CM | POA: Diagnosis present

## 2019-09-02 NOTE — Therapy (Signed)
[place], [place], [address] Phone: [phone]   Fax:  [phone]  Physical Therapy Treatment  Patient Details  Name: Mariah Vaughn MRN: [id number] Date of Birth: 1965-06-11 Referring Provider (PT): Dr. Audelia Hives   Encounter Date: 09/02/2019  PT End of Session - 09/02/19 1200    Visit Number  3    Number of Visits  6    Date for PT Re-Evaluation  09/24/19    PT Start Time  1132    PT Stop Time  1214    PT Time Calculation (min)  42 min    Activity Tolerance  Patient tolerated treatment well    Behavior During Therapy  [facility] for tasks assessed/performed       Past Medical History:  Diagnosis Date  . Allergic rhinitis   . Anxiety   . Asthma   . Bipolar 2 disorder ([place])   . Bulging lumbar disc    C SPINE  . Depression   . Gastritis    GANEM  . History of palpitations    HX OF PAC'S AND HX OF PVC'S  . Hx of smoking   . Knee pain, right     Past Surgical History:  Procedure Laterality Date  . COLONSCOPY  08/2010  . ESOPHAGOGASTRODUODENOSCOPY  08/2010  . ESOPHAGOGASTRODUODENOSCOPY  12/2014  . SEPTOPLASTY  08/2010  . TURBINATE REDUCTION  08/2010  . WISDOM TOOTH EXTRACTION     AGE 5    There were no vitals filed for this visit.  Subjective Assessment - 09/02/19 1137    Subjective  Pt just did burn body bootcamp.  Has to modify her workouts , depending on the day.  Feels tight.  The exercises don;t help the pain.    Currently in Pain?  Yes    Pain Score  --   not rated   Pain Location  Scapula    Pain Orientation  Left    Pain Descriptors / Indicators  Tightness;Other (Comment)   locking up   Pain Type  Chronic pain    Pain Onset  More than a month ago    Pain Frequency  Constant    Aggravating Factors   sitting up, lifting    Pain Relieving Factors  taking off bra, massage therapy and scapular release    Effect of Pain on Daily Activities  uncomfortable all the time          [facility] Adult PT Treatment/Exercise - 09/02/19 0001      Shoulder Exercises: Supine   Protraction  AAROM;Both;10 reps    Protraction Weight (lbs)    Tried to get into more foam roller exercises but hamstrings tightened up      Shoulder Exercises: Standing   Horizontal ABduction  Strengthening;Right;Left;10 reps    Shoulder Flexion Weight (lbs)  narrow grip blue  x 10 foam roller  Standing      Shoulder Exercises: Stretch   Other Shoulder Stretches  thoracic extension over foam roller 3 levels with cues     Other Shoulder Stretches  tractioning stretches with stretch out strap    multiple positions for capsule stretch and into rhomboids with chin tuck      Manual Therapy   Scapular Mobilization  sidelying scapular distraction & mobilization          [facility] Adult PT Treatment/Exercise - 09/02/19 0001      Shoulder Exercises: Supine   Protraction  AAROM;Both;10 reps    Protraction  Weight (lbs)         Shoulder Exercises: Standing   Horizontal ABduction  Strengthening;Right;Left;10 reps    Shoulder Flexion Weight (lbs)  narrow grip blue  x 10 foam roller       Shoulder Exercises: Stretch   Other Shoulder Stretches  thoracic extension over foam roller 3 levels with cues     Other Shoulder Stretches  tractioning stretches with stretch out strap    multiple positions      Manual Therapy   Manual Therapy  Joint mobilization;Myofascial release;Scapular mobilization;Passive ROM    Manual therapy comments  multiple tries in prone and sidelying    difficulty relaxing as time went on    Joint Mobilization  rotational mobs thoracic spine, and P/A mobs thoracic T3-T7    Myofascial Release  L scapula    Scapular Mobilization  sidelying scapular distraction & mobilization   facing patient more effective          PT Education - 09/02/19 1315    Education Details  various methods to mobilize scapula and thoracic spine    Person(s) Educated  Patient    Methods   Explanation;Demonstration    Comprehension  Need further instruction;Verbal cues required;Tactile cues required          PT Long Term Goals - 09/02/19 1316      PT LONG TERM GOAL #1   Title  Pt will be I with HEP for neck and upper back (stability, flexibility)    Status  On-going      PT LONG TERM GOAL #2   Title  Pt will be able to report increased ability to reach to mid back with Rt UE and overhead    Status  On-going      PT LONG TERM GOAL #3   Title  Pt will be able to demo 5/5 strength in bilateral shoulders (extension and horiz abd)    Status  On-going      PT LONG TERM GOAL #4   Title  Pt will demonstrate good alignment in neck and scapula for overhead lifting with min to no cues.    Status  On-going            Plan - 09/02/19 1317    Clinical Impression Statement  Patient had relief of tightness after her last session , scapular release felt good for the rest of the day.  She has apparently tried tools and various practitioners for this problem. She will benefit from further skilled PT to address stiffness in spine and muscles surrounding scapula.    PT Treatment/Interventions  ADLs/Self Care Home Management;Therapeutic activities;Patient/family education;Moist Heat;Neuromuscular re-education;Manual techniques;Therapeutic exercise;Functional mobility training;Taping    PT Next Visit Plan  check HEP, manual T spine mobs, Rt scap.  Prone ball ex    PT Home Exercise Plan  wall angel (hold pose), corner stretch, horiz pull and narrow grip flexion, mid thoracic stretches, threading the needle, upper trap stretch    Consulted and Agree with Plan of Care  Patient       Patient will benefit from skilled therapeutic intervention in order to improve the following deficits and impairments:  Increased fascial restricitons, Pain, Impaired sensation, Postural dysfunction, Decreased mobility, Impaired UE functional use, Hypomobility, Decreased strength, Decreased range of motion,  Impaired flexibility  Visit Diagnosis: Cervicalgia  Muscle weakness (generalized)  Pain in thoracic spine     Problem List Patient Active Problem List   Diagnosis Date Noted  . Neck pain 07/26/2019  .  Back pain 07/26/2019  . Symptomatic mammary hypertrophy 07/26/2019  . DOE (dyspnea on exertion) 10/07/2016  . Nonspecific abnormal unspecified cardiovascular function study 05/10/2013    Mariah Vaughn 09/02/2019, 1:25 PM  [facility] [address] [place], [place], [address] Phone: [phone]   Fax:  [phone]  Name: Mariah Vaughn MRN: [id number] Date of Birth: 1966-04-02  Raeford Razor, PT 09/02/19 1:25 PM Phone: [phone] Fax: [phone]

## 2019-09-09 ENCOUNTER — Encounter: Payer: Self-pay | Admitting: Physical Therapy

## 2019-09-09 ENCOUNTER — Ambulatory Visit: Payer: 59 | Admitting: Physical Therapy

## 2019-09-09 ENCOUNTER — Other Ambulatory Visit: Payer: Self-pay

## 2019-09-09 DIAGNOSIS — M542 Cervicalgia: Secondary | ICD-10-CM | POA: Diagnosis not present

## 2019-09-09 DIAGNOSIS — M6281 Muscle weakness (generalized): Secondary | ICD-10-CM

## 2019-09-09 DIAGNOSIS — M546 Pain in thoracic spine: Secondary | ICD-10-CM

## 2019-09-09 NOTE — Therapy (Signed)
[place], [place], [address] Phone: [phone]   Fax:  [phone]  Physical Therapy Treatment  Patient Details  Name: Mariah Vaughn MRN: [id number] Date of Birth: July 26, 1965 Referring Provider (PT): Dr. Audelia Hives   Encounter Date: 09/09/2019  PT End of Session - 09/09/19 1152    Visit Number  4    Number of Visits  6    Date for PT Re-Evaluation  09/24/19    PT Start Time  1150    PT Stop Time  1220    PT Time Calculation (min)  30 min    Activity Tolerance  Patient tolerated treatment well    Behavior During Therapy  [facility] for tasks assessed/performed       Past Medical History:  Diagnosis Date  . Allergic rhinitis   . Anxiety   . Asthma   . Bipolar 2 disorder ([place])   . Bulging lumbar disc    C SPINE  . Depression   . Gastritis    GANEM  . History of palpitations    HX OF PAC'S AND HX OF PVC'S  . Hx of smoking   . Knee pain, right     Past Surgical History:  Procedure Laterality Date  . COLONSCOPY  08/2010  . ESOPHAGOGASTRODUODENOSCOPY  08/2010  . ESOPHAGOGASTRODUODENOSCOPY  12/2014  . SEPTOPLASTY  08/2010  . TURBINATE REDUCTION  08/2010  . WISDOM TOOTH EXTRACTION     AGE 54    There were no vitals filed for this visit.  Subjective Assessment - 09/09/19 1152    Subjective  I was sore for 2 days after last apt. I skip upper body day at burn boot camp.    Currently in Pain?  Yes    Pain Score  3     Pain Location  Scapula    Pain Orientation  Right;Left    Pain Descriptors / Indicators  Sore    Pain Radiating Towards  upper traps         [facility] PT Assessment - 09/09/19 0001      Assessment   Medical Diagnosis  neck pain, thoracic     Referring Provider (PT)  Dr. Lyndee Leo Dillingham      Palpation   Spinal mobility  limited scapular protraction on Left side                   [facility] Adult PT Treatment/Exercise - 09/09/19 0001      Shoulder  Exercises: Prone   Retraction  15 reps    Retraction Limitations  tactile cues required    Extension  15 reps    Extension Limitations  retraction + extension, tactile cues required    Other Prone Exercises  plank protraction/retraction- performed in standing plank at table and knee/hand plank      Manual Therapy   Joint Mobilization  scapular distraction & mobilization bilaterally, Lt rib mobs with breathing             PT Education - 09/09/19 1239    Education Details  bra wear, activation v exercise, incorporating motions into other exercises, POC    Person(s) Educated  Patient    Methods  Explanation;Demonstration;Tactile cues;Verbal cues;Handout    Comprehension  Verbalized understanding;Returned demonstration;Verbal cues required;Tactile cues required;Need further instruction          PT Long Term Goals - 09/02/19 1316      PT LONG TERM GOAL #1   Title  Pt will be I with HEP for neck and upper back (stability, flexibility)    Status  On-going      PT LONG TERM GOAL #2   Title  Pt will be able to report increased ability to reach to mid back with Rt UE and overhead    Status  On-going      PT LONG TERM GOAL #3   Title  Pt will be able to demo 5/5 strength in bilateral shoulders (extension and horiz abd)    Status  On-going      PT LONG TERM GOAL #4   Title  Pt will demonstrate good alignment in neck and scapula for overhead lifting with min to no cues.    Status  On-going            Plan - 09/09/19 1344    Clinical Impression Statement  Minimal protraction noted on Left side when in prone addressed today. Pt had difficulty feeling it in the beggining but improved as exercises were performed. Has core workout at burn bootcamp this afternoon and was encouraged to utilize scapular retraction and be aware of positioning. Has difficulty finding a bra that fits and we discussed options for this today.    PT Treatment/Interventions  ADLs/Self Care Home  Management;Therapeutic activities;Patient/family education;Moist Heat;Neuromuscular re-education;Manual techniques;Therapeutic exercise;Functional mobility training;Taping    PT Next Visit Plan  continue manual PRN, add weights/bands to retraction    PT Home Exercise Plan  wall angel (hold pose), corner stretch, horiz pull and narrow grip flexion, mid thoracic stretches, threading the needle, upper trap stretch; prone retraction, plank protraction    Consulted and Agree with Plan of Care  Patient       Patient will benefit from skilled therapeutic intervention in order to improve the following deficits and impairments:  Increased fascial restricitons, Pain, Impaired sensation, Postural dysfunction, Decreased mobility, Impaired UE functional use, Hypomobility, Decreased strength, Decreased range of motion, Impaired flexibility  Visit Diagnosis: Cervicalgia  Muscle weakness (generalized)  Pain in thoracic spine     Problem List Patient Active Problem List   Diagnosis Date Noted  . Neck pain 07/26/2019  . Back pain 07/26/2019  . Symptomatic mammary hypertrophy 07/26/2019  . DOE (dyspnea on exertion) 10/07/2016  . Nonspecific abnormal unspecified cardiovascular function study 05/10/2013  Mariah Vaughn C. Breyer Tejera PT, DPT 09/09/19 1:49 PM   [facility] [address] [place], [place], [address] Phone: [phone]   Fax:  [phone]  Name: Mariah Vaughn MRN: [id number] Date of Birth: Mar 08, 1966

## 2019-09-16 ENCOUNTER — Other Ambulatory Visit: Payer: Self-pay

## 2019-09-16 ENCOUNTER — Ambulatory Visit: Payer: 59 | Admitting: Physical Therapy

## 2019-09-16 ENCOUNTER — Encounter: Payer: Self-pay | Admitting: Physical Therapy

## 2019-09-16 ENCOUNTER — Encounter: Payer: 59 | Admitting: Physical Therapy

## 2019-09-16 DIAGNOSIS — M542 Cervicalgia: Secondary | ICD-10-CM

## 2019-09-16 DIAGNOSIS — M6281 Muscle weakness (generalized): Secondary | ICD-10-CM

## 2019-09-16 DIAGNOSIS — M546 Pain in thoracic spine: Secondary | ICD-10-CM

## 2019-09-16 NOTE — Therapy (Signed)
[place], [place], [address] Phone: [phone]   Fax:  [phone]  Physical Therapy Treatment/Discharge  Patient Details  Name: Mariah Vaughn MRN: [id number] Date of Birth: Dec 11, 1965 Referring Provider (PT): Dr. Audelia Hives   Encounter Date: 09/16/2019  PT End of Session - 09/16/19 1348    Visit Number  5    Number of Visits  6    Date for PT Re-Evaluation  09/24/19    PT Start Time  1331    PT Stop Time  1404    PT Time Calculation (min)  33 min    Activity Tolerance  Patient tolerated treatment well    Behavior During Therapy  [facility] for tasks assessed/performed       Past Medical History:  Diagnosis Date  . Allergic rhinitis   . Anxiety   . Asthma   . Bipolar 2 disorder ([place])   . Bulging lumbar disc    C SPINE  . Depression   . Gastritis    GANEM  . History of palpitations    HX OF PAC'S AND HX OF PVC'S  . Hx of smoking   . Knee pain, right     Past Surgical History:  Procedure Laterality Date  . COLONSCOPY  08/2010  . ESOPHAGOGASTRODUODENOSCOPY  08/2010  . ESOPHAGOGASTRODUODENOSCOPY  12/2014  . SEPTOPLASTY  08/2010  . TURBINATE REDUCTION  08/2010  . WISDOM TOOTH EXTRACTION     AGE 54    There were no vitals filed for this visit.  Subjective Assessment - 09/16/19 1332    Subjective  "This is going to be my last visit. I'm going to try and find some things that I can work on from home. I feel good after I leave the session, but the pain comes back. I just want some exercises that I can take home, because I don't feel as if I'm making progress. I still don't know if I'm going to get the surgery."    Pertinent History  spinal stenosis, Rt shoulder fracture    Limitations  Other (comment);Sitting;Lifting;Reading    How long can you sit comfortably?  10-15 mins    Patient Stated Goals  Pt would like to have less pain    Currently in Pain?  Yes    Pain Score  4     Pain  Location  Scapula    Pain Orientation  Right;Left    Pain Descriptors / Indicators  Sore    Pain Radiating Towards  upper traps    Pain Onset  More than a month ago    Pain Frequency  Constant    Aggravating Factors   sitting up, lifting    Pain Relieving Factors  taking off bra, massage therapy and scapular release    Effect of Pain on Daily Activities  uncomfortable all the time    Pain Score  4    Pain Location  Neck         [facility] PT Assessment - 09/16/19 0001      Observation/Other Assessments   Focus on Therapeutic Outcomes (FOTO)   45% limited       AROM   Cervical Flexion  36    Cervical Extension  42    Cervical - Right Side Bend  30    Cervical - Left Side Bend  30      Strength   Strength Assessment Site  Shoulder    Right Shoulder Flexion  5/5  Right Shoulder Extension  5/5    Right Shoulder ABduction  5/5    Left Shoulder Flexion  5/5    Left Shoulder Extension  5/5    Left Shoulder ABduction  5/5                   [facility] Adult PT Treatment/Exercise - 09/16/19 0001      Exercises   Exercises  Other Exercises      Shoulder Exercises: Prone   Other Prone Exercises  Thread the needle stretch, hold for 30 secs     Other Prone Exercises  Prone scap protract/retract      Shoulder Exercises: Sidelying   Other Sidelying Exercises  Subscap stretch, hold for 60 secs on both sides       Shoulder Exercises: Standing   Other Standing Exercises  Prone push up plus       Manual Therapy   Scapular Mobilization  sidelying scapular distraction & mobilization             PT Education - 09/16/19 1359    Education Details  Patient educated on new HEP and maintenance outside of PT.    Person(s) Educated  Patient    Methods  Explanation;Demonstration;Handout    Comprehension  Verbalized understanding          PT Long Term Goals - 09/16/19 1336      PT LONG TERM GOAL #1   Title  Pt will be I with HEP for neck and upper back (stability,  flexibility)    Status  Achieved      PT LONG TERM GOAL #2   Title  Pt will be able to report increased ability to reach to mid back with Rt UE and overhead    Baseline  tight end range, reports that she has been able to increase ROM    Status  Partially Met      PT LONG TERM GOAL #3   Title  Pt will be able to demo 5/5 strength in bilateral shoulders (extension and horiz abd)    Baseline  5/5 strength  - 4/26    Status  Achieved      PT LONG TERM GOAL #4   Title  Pt will demonstrate good alignment in neck and scapula for overhead lifting with min to no cues.    Baseline  Scapula symmetry with overhead lifting    Status  Achieved            Plan - 09/16/19 1351    Clinical Impression Statement  Chevi presents to the clinic with continued neck and trap pain. She requested to discontinue therapy. Although her FOTO score decrease, she has made great improvements in UE strength, as well as cervical flexion and extension. Upon her request, she was provided some new exercises to help her manage her pain at home. We agreed that discharge was appropriate as the patient has not noticed a decrease in pain.    Personal Factors and Comorbidities  Behavior Pattern;Comorbidity 1;Past/Current Experience;Comorbidity 2    Comorbidities  chronic neck pain, anxiety    Examination-Activity Limitations  Carry;Sit;Reach Overhead;Lift    Examination-Participation Restrictions  Community Activity    Stability/Clinical Decision Making  Stable/Uncomplicated    Clinical Decision Making  Low    Rehab Potential  Good    PT Frequency  1x / week    PT Duration  6 weeks    PT Treatment/Interventions  ADLs/Self Care Home Management;Therapeutic activities;Patient/family education;Moist Heat;Neuromuscular  re-education;Manual techniques;Therapeutic exercise;Functional mobility training;Taping    PT Home Exercise Plan  wall angel (hold pose), corner stretch, horiz pull and narrow grip flexion, mid thoracic  stretches, threading the needle, upper trap stretch; prone retraction, plank protraction, open book, thread the needle, prone push ups, subscap stretch, wall pushups w/ chin tucks and rounded shouders    Consulted and Agree with Plan of Care  Patient       Patient will benefit from skilled therapeutic intervention in order to improve the following deficits and impairments:  Increased fascial restricitons, Pain, Impaired sensation, Postural dysfunction, Decreased mobility, Impaired UE functional use, Hypomobility, Decreased strength, Decreased range of motion, Impaired flexibility  Visit Diagnosis: Cervicalgia  Muscle weakness (generalized)  Pain in thoracic spine     Problem List Patient Active Problem List   Diagnosis Date Noted  . Neck pain 07/26/2019  . Back pain 07/26/2019  . Symptomatic mammary hypertrophy 07/26/2019  . DOE (dyspnea on exertion) 10/07/2016  . Nonspecific abnormal unspecified cardiovascular function study 05/10/2013    Laveda Norman, SPT 09/16/2019, 2:18 PM  [facility] [address] [place], [place], [address] Phone: [phone]   Fax:  [phone]  Name: Lashe Oliveira MRN: [id number] Date of Birth: 1966/01/28     PHYSICAL THERAPY DISCHARGE SUMMARY  Visits from Start of Care: 5  Current functional level related to goals / functional outcomes: FOTO - 45% limited    Remaining deficits: Discomfort with reaching behind head    Education / Equipment: HEP, TheraBand  Plan: Patient agrees to discharge.  Patient goals were partially met. Patient is being discharged due to the patient's request.  ?????

## 2019-09-23 ENCOUNTER — Encounter: Payer: 59 | Admitting: Physical Therapy

## 2020-03-11 ENCOUNTER — Ambulatory Visit: Payer: 59 | Admitting: Interventional Cardiology

## 2020-04-09 ENCOUNTER — Other Ambulatory Visit: Payer: Self-pay

## 2020-04-09 ENCOUNTER — Ambulatory Visit: Payer: 59 | Admitting: Interventional Cardiology

## 2020-04-09 ENCOUNTER — Encounter: Payer: Self-pay | Admitting: Interventional Cardiology

## 2020-04-09 ENCOUNTER — Encounter: Payer: Self-pay | Admitting: *Deleted

## 2020-04-09 VITALS — BP 124/86 | HR 78 | Ht 63.5 in | Wt 174.4 lb

## 2020-04-09 DIAGNOSIS — R0602 Shortness of breath: Secondary | ICD-10-CM

## 2020-04-09 DIAGNOSIS — E669 Obesity, unspecified: Secondary | ICD-10-CM

## 2020-04-09 DIAGNOSIS — R002 Palpitations: Secondary | ICD-10-CM | POA: Diagnosis not present

## 2020-04-09 NOTE — Patient Instructions (Addendum)
Medication Instructions:  Your physician recommends that you continue on your current medications as directed. Please refer to the Current Medication list given to you today.  *If you need a refill on your cardiac medications before your next appointment, please call your pharmacy*   Lab Work: None  If you have labs (blood work) drawn today and your tests are completely normal, you will receive your results only by: Marland Kitchen MyChart Message (if you have MyChart) OR . A paper copy in the mail If you have any lab test that is abnormal or we need to change your treatment, we will call you to review the results.   Testing/Procedures: Your physician has requested that you have an echocardiogram. Echocardiography is a painless test that uses sound waves to create images of your heart. It provides your doctor with information about the size and shape of your heart and how well your heart's chambers and valves are working. This procedure takes approximately one hour. There are no restrictions for this procedure.  Your physician has recommended that you wear a 14 day monitor. These monitors are medical devices that record the heart's electrical activity. Doctors most often use these monitors to diagnose arrhythmias. Arrhythmias are problems with the speed or rhythm of the heartbeat. The monitor is a small, portable device. You can wear one while you do your normal daily activities. This is usually used to diagnose what is causing palpitations/syncope (passing out).  Follow-Up: Based on test results   Other Instructions ZIO XT- Long Term Monitor Instructions   Your physician has requested you wear your ZIO patch monitor 14 days.   This is a single patch monitor.  [employer] supplies one patch monitor per enrollment.  Additional stickers are not available.   Please do not apply patch if you will be having a Nuclear Stress Test, Echocardiogram, Cardiac CT, MRI, or Chest Xray during the time frame you would  be wearing the monitor. The patch cannot be worn during these tests.  You cannot remove and re-apply the ZIO XT patch monitor.   Your ZIO patch monitor will be sent USPS Priority mail from [facility] directly to your home address. The monitor may also be mailed to a PO BOX if home delivery is not available.   It may take 3-5 days to receive your monitor after you have been enrolled.   Once you have received you monitor, please review enclosed instructions.  Your monitor has already been registered assigning a specific monitor serial # to you.   Applying the monitor   Shave hair from upper left chest.   Hold abrader disc by orange tab.  Rub abrader in 40 strokes over left upper chest as indicated in your monitor instructions.   Clean area with 4 enclosed alcohol pads .  Use all pads to assure are is cleaned thoroughly.  Let dry.   Apply patch as indicated in monitor instructions.  Patch will be place under collarbone on left side of chest with arrow pointing upward.   Rub patch adhesive wings for 2 minutes.Remove white label marked "1".  Remove white label marked "2".  Rub patch adhesive wings for 2 additional minutes.   While looking in a mirror, press and release button in center of patch.  A small green light will flash 3-4 times .  This will be your only indicator the monitor has been turned on.     Do not shower for the first 24 hours.  You may shower after the  first 24 hours.   Press button if you feel a symptom. You will hear a small click.  Record Date, Time and Symptom in the Patient Log Book.   When you are ready to remove patch, follow instructions on last 2 pages of Patient Log Book.  Stick patch monitor onto last page of Patient Log Book.   Place Patient Log Book in [place] box.  Use locking tab on box and tape box closed securely.  The [employer] and [employer] has [employer] on it.  Please place in mailbox as soon as possible.  Your physician should have your test  results approximately 7 days after the monitor has been mailed back to [facility].   Call Sharon at [phone] if you have questions regarding your ZIO XT patch monitor.  Call them immediately if you see an orange light blinking on your monitor.   If your monitor falls off in less than 4 days contact our Monitor department at [phone].  If your monitor becomes loose or falls off after 4 days call [employer] at [phone] for suggestions on securing your monitor.

## 2020-04-09 NOTE — Progress Notes (Signed)
Cardiology Office Note   Date:  04/09/2020   ID:  [name], Vaughn 12-07-1965, MRN [id number]  PCP:  Kathyrn Lass, MD    No chief complaint on file.  DOE, palpitations  Wt Readings from Last 3 Encounters:  04/09/20 174 lb 6.4 oz (79.1 kg)  07/26/19 165 lb 3.2 oz (74.9 kg)  10/07/16 158 lb (71.7 kg)       History of Present Illness: Mariah Vaughn is a 54 y.o. female who is referred for palpitations and shortness of breath.  I saw her many years ago.  Echo in 2018: "Left ventricle: The cavity size was normal. Systolic function was  normal. The estimated ejection fraction was in the range of 55%  to 60%. Wall motion was normal; there were no regional wall  motion abnormalities. Left ventricular diastolic function  parameters were normal.  - Aortic valve: Transvalvular velocity was within the normal range.  There was no stenosis. There was no regurgitation.  - Mitral valve: Transvalvular velocity was within the normal range.  There was no evidence for stenosis. There was trivial  regurgitation.  - Right ventricle: The cavity size was normal. Wall thickness was  normal. Systolic function was normal.  - Atrial septum: No defect or patent foramen ovale was identified  by color flow Doppler.  - Tricuspid valve: There was trivial regurgitation.  - Pulmonary arteries: Systolic pressure was within the normal  range. PA peak pressure: 20 mm Hg (S). "  Her [employer] watch has told her that she was in atrial fibrillation and her heart rate was in the 120s.  She felt very tired.  No chest pain.    She also feels that shortness of breath is coming on with walking and minimal exercise.  Also some dizziness with exercise.   Denies : Chest pain. Leg edema. Nitroglycerin use. Orthopnea. Palpitations. Paroxysmal nocturnal dyspnea. Shortness of breath. Syncope.   Mother has AFib.  Her sister has AFib.    Cannot take aspirin with lithium per her  report.    Past Medical History:  Diagnosis Date  . Allergic rhinitis   . Anxiety   . Asthma   . Bipolar 2 disorder ([place])   . Bulging lumbar disc    C SPINE  . Depression   . Gastritis    GANEM  . History of palpitations    HX OF PAC'S AND HX OF PVC'S  . Hx of smoking   . Knee pain, right     Past Surgical History:  Procedure Laterality Date  . COLONSCOPY  08/2010  . ESOPHAGOGASTRODUODENOSCOPY  08/2010  . ESOPHAGOGASTRODUODENOSCOPY  12/2014  . SEPTOPLASTY  08/2010  . TURBINATE REDUCTION  08/2010  . WISDOM TOOTH EXTRACTION     AGE 29     Current Outpatient Medications  Medication Sig Dispense Refill  . albuterol (PROVENTIL HFA;VENTOLIN HFA) 108 (90 Base) MCG/ACT inhaler Inhale 2 puffs into the lungs every 4 (four) hours as needed for wheezing or shortness of breath.    . ARIPiprazole (ABILIFY) 5 MG tablet Take 5 mg by mouth daily.    . citalopram (CELEXA) 10 MG tablet Take as directed.    . lithium carbonate (LITHOBID) 300 MG CR tablet Take 300-600 mg by mouth daily.    . valACYclovir (VALTREX) 1000 MG tablet Take 2,000 mg by mouth as directed. TAKE 2 TABLETS EVERY 12 HRS X 1 DAY PRN FOR OUTBREAK      No current facility-administered medications for this visit.  Allergies:   Amoxicillin, Cefaclor, Codeine, Doxycycline, Flonase [fluticasone propionate], Keflex [cephalexin], Moviprep [peg-kcl-nacl-nasulf-na asc-c], Penicillins, Sulfur, and Tamiflu [oseltamivir phosphate]    Social History:  The patient  reports that she quit smoking about 18 years ago. Her smoking use included cigarettes. She has a 39.00 pack-year smoking history. She has never used smokeless tobacco. She reports current alcohol use. She reports that she does not use drugs.   Family History:  The patient's family history includes ADD / ADHD in her son; Atrial fibrillation in her mother; Autism in her son; Breast cancer (age of onset: 80) in her mother; Lung cancer in her maternal grandmother; Other in  her father; Ovarian cancer (age of onset: 52) in her sister; Pancreatitis in her father; Pneumonia in her father; Prostate cancer (age of onset: 27) in her paternal grandfather; Stroke in her father.    ROS:  Please see the history of present illness.   Otherwise, review of systems are positive for DOE.   All other systems are reviewed and negative.    PHYSICAL EXAM: VS:  BP 124/86   Pulse 78   Ht 5' 3.5" (1.613 m)   Wt 174 lb 6.4 oz (79.1 kg)   SpO2 98%   BMI 30.41 kg/m  , BMI Body mass index is 30.41 kg/m. GEN: Well nourished, well developed, in no acute distress  HEENT: normal  Neck: no JVD, carotid bruits, or masses Cardiac: RRR; no murmurs, rubs, or gallops,no edema  Respiratory:  clear to auscultation bilaterally, normal work of breathing GI: soft, nontender, nondistended, + BS MS: no deformity or atrophy  Skin: warm and dry, no rash Neuro:  Strength and sensation are intact Psych: euthymic mood, full affect   EKG:   The ekg ordered today demonstrates normal ECG   Recent Labs: No results found for requested labs within last 8760 hours.   Lipid Panel No results found for: CHOL, TRIG, HDL, CHOLHDL, VLDL, LDLCALC, LDLDIRECT   Other studies Reviewed: Additional studies/ records that were reviewed today with results demonstrating: LDL 129 in 02/2019.   ASSESSMENT AND PLAN:  1. Palpitations: Plan for 14 day monitor.  No syncope.  Sx sound benign.  2. DOE: Plan for echo.  No CHF signs on exam. 3. Obesity: Whole food, plant based diet.  Limiting caffeine and sugary drinks to avoid mania or psychotic episodes.  She has gained weight over the past few years.   Current medicines are reviewed at length with the patient today.  The patient concerns regarding her medicines were addressed.  The following changes have been made:  No change  Labs/ tests ordered today include:  No orders of the defined types were placed in this encounter.   Recommend 150 minutes/week of  aerobic exercise Low fat, low carb, high fiber diet recommended  Disposition:   FU in based on results   Signed, Larae Grooms, MD  04/09/2020 11:38 AM    [facility] [place], [place], [place]  [address] Phone: [phone]; Fax: [phone]

## 2020-04-09 NOTE — Progress Notes (Signed)
Patient ID: Mariah Vaughn, female   DOB: 1965/11/23, 54 y.o.   MRN: [id number] Patient enrolled for [employer] to ship a 14 day ZIO XT long term holter monitor to her address on record.

## 2020-04-13 ENCOUNTER — Ambulatory Visit (INDEPENDENT_AMBULATORY_CARE_PROVIDER_SITE_OTHER): Payer: 59

## 2020-04-13 DIAGNOSIS — R002 Palpitations: Secondary | ICD-10-CM | POA: Diagnosis not present

## 2020-05-11 ENCOUNTER — Other Ambulatory Visit: Payer: Self-pay

## 2020-05-11 ENCOUNTER — Ambulatory Visit (HOSPITAL_COMMUNITY): Payer: 59 | Attending: Cardiovascular Disease

## 2020-05-11 DIAGNOSIS — R0602 Shortness of breath: Secondary | ICD-10-CM

## 2020-05-11 LAB — ECHOCARDIOGRAM COMPLETE
Area-P 1/2: 3.99 cm2
S' Lateral: 3 cm

## 2020-05-12 ENCOUNTER — Telehealth: Payer: Self-pay

## 2020-05-12 NOTE — Telephone Encounter (Signed)
Spoke with the patient and advised her of recommendations from Dr. Irish Lack. Patient states that since her premature beats are so infrequent that she does not want to try metoprolol at this time. She states that she is also following up with her OBGYN in regards to some menopausal symptoms.

## 2020-05-12 NOTE — Telephone Encounter (Signed)
Left message for patient to call back  

## 2020-05-12 NOTE — Telephone Encounter (Signed)
Her premature beats are < 1% of her heart beats.  She can try metoprolol to decrease them further and see if she feels better.    Panic attacks could also give a sensation of palpitations and/or shortness of breath, but it is fine to see if the metoprolol helps.

## 2020-05-12 NOTE — Telephone Encounter (Signed)
-----   Message from Jettie Booze, MD sent at 05/12/2020  9:08 AM EST ----- No significant heart problems noted.  Occasional skipped beats that may cause sx for a few seconds.  Nothing serious or sustained.

## 2020-05-12 NOTE — Telephone Encounter (Signed)
The patient has been notified of the result and verbalized understanding. Patient states that she is still very concerned about her tachycardia and skipped heart beats. She states that her mother started out with the same issues and ended up with a pacemaker. Patient is concerned about atrial fibrillation which I have reassured her was not seen on the heart monitor. She states that she also has a history of panic attacks and her psychiatrist has put her on lithium. She has many concerns about her SOB as well and episodes when her heart rate increases and she becomes flushed and feels like she may pass out at times. She also mentioned that her PCP prescribed her metoprolol for her fast heart rate but she has not started taking it.  Patient is requesting a follow up visit with Dr. Irish Lack to discuss further.

## 2020-05-12 NOTE — Telephone Encounter (Signed)
Pt called in returning [place] call

## 2020-06-23 ENCOUNTER — Other Ambulatory Visit: Payer: Self-pay | Admitting: Obstetrics and Gynecology

## 2020-06-23 DIAGNOSIS — Z1231 Encounter for screening mammogram for malignant neoplasm of breast: Secondary | ICD-10-CM

## 2020-07-10 ENCOUNTER — Other Ambulatory Visit: Payer: Self-pay | Admitting: Obstetrics and Gynecology

## 2020-07-10 DIAGNOSIS — R1031 Right lower quadrant pain: Secondary | ICD-10-CM

## 2020-07-13 ENCOUNTER — Other Ambulatory Visit: Payer: Self-pay

## 2020-07-13 ENCOUNTER — Ambulatory Visit
Admission: RE | Admit: 2020-07-13 | Discharge: 2020-07-13 | Disposition: A | Discharge status: Home / Self Care | Payer: 59 | Source: Ambulatory Visit | Attending: Obstetrics and Gynecology | Admitting: Obstetrics and Gynecology

## 2020-07-13 DIAGNOSIS — R1031 Right lower quadrant pain: Secondary | ICD-10-CM

## 2020-07-15 ENCOUNTER — Other Ambulatory Visit: Payer: Self-pay

## 2020-07-15 ENCOUNTER — Emergency Department (HOSPITAL_COMMUNITY): Payer: 59

## 2020-07-15 ENCOUNTER — Emergency Department (HOSPITAL_COMMUNITY)
Admission: EM | Admit: 2020-07-15 | Discharge: 2020-07-15 | Disposition: A | Discharge status: Home / Self Care | Payer: 59 | Attending: Emergency Medicine | Admitting: Emergency Medicine

## 2020-07-15 ENCOUNTER — Encounter (HOSPITAL_COMMUNITY): Payer: Self-pay

## 2020-07-15 DIAGNOSIS — R0602 Shortness of breath: Secondary | ICD-10-CM | POA: Diagnosis not present

## 2020-07-15 DIAGNOSIS — Z87891 Personal history of nicotine dependence: Secondary | ICD-10-CM | POA: Insufficient documentation

## 2020-07-15 DIAGNOSIS — R112 Nausea with vomiting, unspecified: Secondary | ICD-10-CM | POA: Diagnosis not present

## 2020-07-15 DIAGNOSIS — J45909 Unspecified asthma, uncomplicated: Secondary | ICD-10-CM | POA: Diagnosis not present

## 2020-07-15 DIAGNOSIS — R1031 Right lower quadrant pain: Secondary | ICD-10-CM | POA: Diagnosis not present

## 2020-07-15 DIAGNOSIS — M25512 Pain in left shoulder: Secondary | ICD-10-CM | POA: Diagnosis not present

## 2020-07-15 DIAGNOSIS — R0789 Other chest pain: Secondary | ICD-10-CM | POA: Diagnosis not present

## 2020-07-15 DIAGNOSIS — R110 Nausea: Secondary | ICD-10-CM

## 2020-07-15 DIAGNOSIS — R079 Chest pain, unspecified: Secondary | ICD-10-CM | POA: Diagnosis present

## 2020-07-15 DIAGNOSIS — M79602 Pain in left arm: Secondary | ICD-10-CM | POA: Diagnosis not present

## 2020-07-15 LAB — LIPASE, BLOOD: Lipase: 49 U/L (ref 11–51)

## 2020-07-15 LAB — CBC
HCT: 42 % (ref 36.0–46.0)
Hemoglobin: 13.5 g/dL (ref 12.0–15.0)
MCH: 28.5 pg (ref 26.0–34.0)
MCHC: 32.1 g/dL (ref 30.0–36.0)
MCV: 88.8 fL (ref 80.0–100.0)
Platelets: 300 10*3/uL (ref 150–400)
RBC: 4.73 MIL/uL (ref 3.87–5.11)
RDW: 12.4 % (ref 11.5–15.5)
WBC: 6 10*3/uL (ref 4.0–10.5)
nRBC: 0 % (ref 0.0–0.2)

## 2020-07-15 LAB — HEPATIC FUNCTION PANEL
ALT: 47 U/L — ABNORMAL HIGH (ref 0–44)
AST: 43 U/L — ABNORMAL HIGH (ref 15–41)
Albumin: 3.8 g/dL (ref 3.5–5.0)
Alkaline Phosphatase: 63 U/L (ref 38–126)
Bilirubin, Direct: 0.1 mg/dL (ref 0.0–0.2)
Indirect Bilirubin: 0.4 mg/dL (ref 0.3–0.9)
Total Bilirubin: 0.5 mg/dL (ref 0.3–1.2)
Total Protein: 7.6 g/dL (ref 6.5–8.1)

## 2020-07-15 LAB — BASIC METABOLIC PANEL
Anion gap: 10 (ref 5–15)
BUN: 17 mg/dL (ref 6–20)
CO2: 24 mmol/L (ref 22–32)
Calcium: 9.3 mg/dL (ref 8.9–10.3)
Chloride: 105 mmol/L (ref 98–111)
Creatinine, Ser: 0.74 mg/dL (ref 0.44–1.00)
GFR, Estimated: >60 mL/min (ref >60)
Glucose, Bld: 107 mg/dL — ABNORMAL HIGH (ref 70–99)
Potassium: 4.5 mmol/L (ref 3.5–5.1)
Sodium: 139 mmol/L (ref 135–145)

## 2020-07-15 LAB — I-STAT BETA HCG BLOOD, ED (NOT ORDERABLE): I-stat hCG, quantitative: <5 m[IU]/mL (ref <5)

## 2020-07-15 LAB — TROPONIN I (HIGH SENSITIVITY)
Troponin I (High Sensitivity): 2 ng/L (ref <18)
Troponin I (High Sensitivity): <2 ng/L (ref <18)

## 2020-07-15 LAB — D-DIMER, QUANTITATIVE: D-Dimer, Quant: 0.34 ug/mL-FEU (ref 0.00–0.50)

## 2020-07-15 MED ORDER — OMEPRAZOLE 20 MG PO CPDR: 20.0000 mg | DELAYED_RELEASE_CAPSULE | Freq: Every day | ORAL | 0 refills | Status: DC

## 2020-07-15 MED ORDER — IOHEXOL 300 MG/ML  SOLN
100.0000 mL | Freq: Once | INTRAMUSCULAR | Status: AC | PRN
  Administered 2020-07-15: 100 mL via INTRAVENOUS

## 2020-07-15 MED ORDER — ONDANSETRON HCL 4 MG/2ML IJ SOLN
4.0000 mg | Freq: Once | INTRAMUSCULAR | Status: AC
  Administered 2020-07-15: 4 mg via INTRAVENOUS
  Filled 2020-07-15: qty 2

## 2020-07-15 MED ORDER — FAMOTIDINE IN NACL 20-0.9 MG/50ML-% IV SOLN
20.0000 mg | Freq: Once | INTRAVENOUS | Status: AC
  Administered 2020-07-15: 20 mg via INTRAVENOUS
  Filled 2020-07-15: qty 50

## 2020-07-15 NOTE — ED Provider Notes (Signed)
[facility] DEPT Provider Note   CSN: [id number] Arrival date & time: 07/15/20  0453     History Chief Complaint  Patient presents with  . Chest Pain    Mariah Vaughn is a 55 y.o. female.  55 y/o female with a PMH of Anxiety, Bipolar, PVCs and PACs presents to the ED with a chief complaint of right lower quadrant pain along with chest pain.  States that the right lower quadrant pain has been constant, ongoing for the past 2 weeks, this is exacerbated with standing along with ambulation.  She has had multiple episodes of diarrhea, attributes this to likely an increase ingestion and grapes.  States that she was seen by her PCP, and sent in for an ultrasound to rule out ovarian torsion.  She reports she thinks this might be her appendix.  In addition, this morning she began to feel stabbing, crushing pressure to the left side of her chest which woke her up from her sleep.  States the pain is exacerbated with lying flat, describing it as "pressure pushing down".  States she has had shortness of breath for the past 3 to 4 months worse with exertion, was evaluated by cardiology Dr. Gladstone Lighter on [facility], who will obtain an echo along with an EKG about a month ago which were within normal limits.  However, became concerned as symptoms woke her up from sleep today.  Dates the chest pain symptoms radiates down her left arm, she is had multiple episodes of emesis nonbilious, nonbloody.  There is no nausea.  She did take some Tylenol 1000 mg prior to arrival as she states "my appendix area was hurting ".  She is scheduled to follow-up with PCP today for further work-up on appendix.  Does have a history of a provoked DVT, priorly on birth control OCPs about 25 years ago.  No past surgical intervention to her abdomen.  No nausea, headaches, weakness or sick contacts.      The history is provided by the patient.  Chest Pain Pain location:  L chest Pain quality: sharp    Pain radiates to:  L arm and L shoulder Pain severity:  Mild Onset quality:  Sudden Duration:  4 hours Timing:  Intermittent Progression:  Unchanged Context: not drug use and not lifting   Worsened by:  Certain positions and movement Ineffective treatments:  None tried Associated symptoms: abdominal pain, shortness of breath and vomiting   Associated symptoms: no back pain, no fever, no headache, no nausea and no weakness   Risk factors: no aortic disease, no birth control, no coronary artery disease, no diabetes mellitus, no hypertension, not female, no prior DVT/PE and no smoking        Past Medical History:  Diagnosis Date  . Allergic rhinitis   . Anxiety   . Asthma   . Bipolar 2 disorder ([place])   . Bulging lumbar disc    C SPINE  . Depression   . Gastritis    GANEM  . History of palpitations    HX OF PAC'S AND HX OF PVC'S  . Hx of smoking   . Knee pain, right     Patient Active Problem List   Diagnosis Date Noted  . Neck pain 07/26/2019  . Back pain 07/26/2019  . Symptomatic mammary hypertrophy 07/26/2019  . DOE (dyspnea on exertion) 10/07/2016  . Nonspecific abnormal unspecified cardiovascular function study 05/10/2013    Past Surgical History:  Procedure Laterality Date  .  COLONSCOPY  08/2010  . ESOPHAGOGASTRODUODENOSCOPY  08/2010  . ESOPHAGOGASTRODUODENOSCOPY  12/2014  . SEPTOPLASTY  08/2010  . TURBINATE REDUCTION  08/2010  . WISDOM TOOTH EXTRACTION     AGE 61     OB History   No obstetric history on file.     Family History  Problem Relation Age of Onset  . Breast cancer Mother 68  . Atrial fibrillation Mother   . Ovarian cancer Sister 95       maternal half sister  . Lung cancer Maternal Grandmother        worked at [employer]  . Prostate cancer Paternal Grandfather 54  . Stroke Father   . Pancreatitis Father   . Pneumonia Father   . Other Father        RESP FAILURE  . ADD / ADHD Son   . Autism Son        SENSORY PROCESSING DISORDER     Social History   Tobacco Use  . Smoking status: Former Smoker    Packs/day: 1.50    Years: 26.00    Pack years: 39.00    Types: Cigarettes    Quit date: 05/23/2001    Years since quitting: 19.1  . Smokeless tobacco: Never Used  Vaping Use  . Vaping Use: Never used  Substance Use Topics  . Alcohol use: Yes    Comment: rarely  . Drug use: No    Home Medications Prior to Admission medications   Medication Sig Start Date End Date Taking? Authorizing Provider  acetaminophen (TYLENOL) 500 MG tablet Take 1,000 mg by mouth every 6 (six) hours as needed for mild pain, fever or headache.   Yes [provider]  albuterol (PROVENTIL HFA;VENTOLIN HFA) 108 (90 Base) MCG/ACT inhaler Inhale 2 puffs into the lungs every 4 (four) hours as needed for wheezing or shortness of breath.   Yes [provider]  ARIPiprazole (ABILIFY) 5 MG tablet Take 2.5 mg by mouth daily. 07/25/19  Yes [provider]  Cholecalciferol (VITAMIN D3 PO) Take 1 capsule by mouth 2 (two) times a week.   Yes [provider]  citalopram (CELEXA) 10 MG tablet Take 2.5 mg by mouth daily. Take as directed.   Yes [provider]  fexofenadine (ALLEGRA) 180 MG tablet Take 180 mg by mouth daily as needed for allergies or rhinitis.   Yes [provider]  lithium carbonate (LITHOBID) 300 MG CR tablet Take 300 mg by mouth daily. 03/24/20  Yes [provider]  omeprazole (PRILOSEC) 20 MG capsule Take 1 capsule (20 mg total) by mouth daily for 10 days. 07/15/20 07/25/20 Yes Soto, Beverley Fiedler, PA-C    Allergies    Amoxicillin, Cefaclor, Cephalexin, Codeine, Doxycycline, Doxycycline monohydrate, Elemental sulfur, Flonase [fluticasone propionate], Influenza vaccines, Other, Peg-kcl-nacl-nasulf-na asc-c, Penicillins, and Tamiflu [oseltamivir phosphate]  Review of Systems   Review of Systems  Constitutional: Negative for fever.  HENT: Negative for sore throat.   Respiratory: Positive  for shortness of breath.   Cardiovascular: Positive for chest pain.  Gastrointestinal: Positive for abdominal pain, diarrhea and vomiting. Negative for blood in stool and nausea.  Genitourinary: Negative for flank pain.  Musculoskeletal: Negative for back pain.  Neurological: Negative for weakness, light-headedness and headaches.  All other systems reviewed and are negative.   Physical Exam Updated Vital Signs BP 105/76   Pulse 81   Temp 98.1 F (36.7 C) (Oral)   Resp 18   Ht 5\' 3"  (1.6 m)   Wt 77.6  kg   LMP  (LMP Unknown) Comment: negative HCG blood test 07-15-20  SpO2 99%   BMI 30.29 kg/m   Physical Exam Vitals and nursing note reviewed.  Constitutional:      Appearance: She is well-developed. She is not ill-appearing.  HENT:     Head: Normocephalic and atraumatic.  Cardiovascular:     Rate and Rhythm: Normal rate.  Pulmonary:     Effort: Pulmonary effort is normal.     Breath sounds: No decreased breath sounds, wheezing, rhonchi or rales.  Chest:     Chest wall: Tenderness present.  Abdominal:     Palpations: Abdomen is soft.     Tenderness: There is abdominal tenderness.  Musculoskeletal:     Cervical back: Normal range of motion and neck supple.  Skin:    General: Skin is warm and dry.  Neurological:     Mental Status: She is alert.     ED Results / Procedures / Treatments   Labs (all labs ordered are listed, but only abnormal results are displayed) Labs Reviewed  BASIC METABOLIC PANEL - Abnormal; Notable for the following components:      Result Value   Glucose, Bld 107 (*)    All other components within normal limits  HEPATIC FUNCTION PANEL - Abnormal; Notable for the following components:   AST 43 (*)    ALT 47 (*)    All other components within normal limits  CBC  LIPASE, BLOOD  D-DIMER, QUANTITATIVE  I-STAT BETA HCG BLOOD, ED ([facility], [facility], [facility] ONLY)  I-STAT BETA HCG BLOOD, ED (NOT ORDERABLE)  TROPONIN I (HIGH SENSITIVITY)  TROPONIN I (HIGH  SENSITIVITY)    EKG EKG Interpretation  Date/Time:  Wednesday July 15 2020 05:03:46 EST Ventricular Rate:  90 PR Interval:    QRS Duration: 94 QT Interval:  370 QTC Calculation: 453 R Axis:   91 Text Interpretation: Sinus rhythm Borderline right axis deviation No old tracing to compare Confirmed by Delora Fuel ([id number]) on 07/15/2020 5:12:27 AM   Radiology DG Chest 2 View  Result Date: 07/15/2020 CLINICAL DATA:  Left chest pain radiating to left shoulder with emesis EXAM: CHEST - 2 VIEW COMPARISON:  Radiograph 01/30/2020 FINDINGS: Basilar atelectasis including more bandlike subsegmental atelectasis or scarring in the left basilar periphery. No consolidative opacity is seen. No pneumothorax or effusion. No convincing features of edema. No acute osseous or soft tissue abnormality. Degenerative changes are present in the imaged spine and shoulders. Levocurvature of the upper thoracic level similar to prior. IMPRESSION: Basilar atelectasis including more bandlike subsegmental atelectasis or scarring in the left basilar periphery. No consolidative opacity. Electronically Signed   By: Lovena Le M.D.   On: 07/15/2020 05:19   CT ABDOMEN PELVIS W CONTRAST  Result Date: 07/15/2020 CLINICAL DATA:  Right lower quadrant abdominal pain for the past 2-3 weeks. EXAM: CT ABDOMEN AND PELVIS WITH CONTRAST TECHNIQUE: Multidetector CT imaging of the abdomen and pelvis was performed using the standard protocol following bolus administration of intravenous contrast. CONTRAST:  147mL OMNIPAQUE IOHEXOL 300 MG/ML  SOLN COMPARISON:  Pelvic ultrasound dated July 13, 2020. CT abdomen pelvis dated June 21, 2019. FINDINGS: Lower chest: No acute abnormality. Hepatobiliary: No focal liver abnormality is seen. No gallstones, gallbladder wall thickening, or biliary dilatation. Pancreas: Unremarkable. No pancreatic ductal dilatation or surrounding inflammatory changes. Spleen: Normal in size without focal  abnormality. Adrenals/Urinary Tract: Adrenal glands are unremarkable. Subcentimeter low-density lesion in the left kidney too small to characterize. No renal calculi or  hydronephrosis. The bladder is unremarkable. Stomach/Bowel: Stomach is within normal limits. Appendix appears normal. No evidence of bowel wall thickening, distention, or inflammatory changes. Vascular/Lymphatic: No significant vascular findings are present. No enlarged abdominal or pelvic lymph nodes. Reproductive: Retroverted uterus with small fibroids again noted. Bilateral adnexa are unremarkable. Other: Tiny fat containing umbilical hernia. No free fluid or pneumoperitoneum. Musculoskeletal: No acute or significant osseous findings. IMPRESSION: 1. No acute intra-abdominal process. Normal appendix. Electronically Signed   By: Titus Dubin M.D.   On: 07/15/2020 12:34   US PELVIC COMPLETE WITH TRANSVAGINAL  Result Date: 07/13/2020 CLINICAL DATA:  RIGHT lower quadrant pain, postmenopausal 1 year ago EXAM: TRANSABDOMINAL AND TRANSVAGINAL ULTRASOUND OF PELVIS TECHNIQUE: Both transabdominal and transvaginal ultrasound examinations of the pelvis were performed. Transabdominal technique was performed for global imaging of the pelvis including uterus, ovaries, adnexal regions, and pelvic cul-de-sac. It was necessary to proceed with endovaginal exam following the transabdominal exam to visualize the uterus, endometrium, and ovaries. COMPARISON:  None FINDINGS: Uterus Measurements: 6.5 x 3.1 x 4.2 cm = volume: 43 mL. Retroverted. Heterogeneous myometrium. Several small focal areas of abnormal echogenicity are seen likely representing small intramural leiomyomata, measuring 17 mm, levin mm, and 9 mm in size. Endometrium Thickness: 6 mm.  No endometrial fluid or focal abnormality Right ovary Measurements: 1.7 x 1.1 x 2.2 cm = volume: 2.1 mL. Normal morphology without mass Left ovary Measurements: 2.1 x 1.6 x 1.8 cm = volume: 3.2 mL. Normal morphology  without mass Other findings No free pelvic fluid.  No adnexal masses. IMPRESSION: Probable small intramural leiomyomata of the uterus, largest 17 mm diameter. Remainder of exam unremarkable. Electronically Signed   By: Lavonia Dana M.D.   On: 07/13/2020 16:44    Procedures Procedures   Medications Ordered in ED Medications  famotidine (PEPCID) IVPB 20 mg premix (0 mg Intravenous Stopped 07/15/20 1140)  ondansetron (ZOFRAN) injection 4 mg (4 mg Intravenous Given 07/15/20 1108)  iohexol (OMNIPAQUE) 300 MG/ML solution 100 mL (100 mLs Intravenous Contrast Given 07/15/20 1149)    ED Course  I have reviewed the triage vital signs and the nursing notes.  Pertinent labs & imaging results that were available during my care of the patient were reviewed by me and considered in my medical decision making (see chart for details).    MDM Rules/Calculators/A&P  Patient with no pertinent past medical history presents to the ED with a chief complaint of chest pain, abdominal pain.  She reports this chest pain woke her up from her sleep today.  Does report this is worse when she lies flat, there was one episode of vomiting associated with this.  She does report she been seeing her PCP recently several times due to right lower abdominal pain, she did have an ultrasound to rule out torsion which was negative.  She also endorses shortness of breath, this has been an ongoing problem for the past 3 to 4 months, does report she had an echo along with monitoring which did not show any acute findings.Dr. Irish Lack is her cardiologist on file.  Looks like she was recommended to start metoprolol due to her premature beats, however she did not want to start these medications per chart review.  Pain seems to be pleuritic in nature, there is some tenderness with palpation of the left upper quadrant.  However labs do appear reassuring, we discussed obtaining D-dimer as patient is not tachycardic or hypoxic at this time.  D-dimer  is negative on today's visit she  does have a prior history of a DVT but this was provoked while taking yes birth control 25 years ago.  No prior history of heart disease, no risk factors such as hypertension, diabetes, high cholesterol.  ECHO 04/13/2020 Left ventricular ejection fraction, by estimation, is 60 to 65%.   HEART SCORE 1-2  Feel that symptoms likely coming from her PVCs, she is followed by cardiology for this.  Discussed obtaining CT imaging in order to further evaluate her abdominal pain has been ongoing for the past 3 weeks.  Agreeable of this at this time.Interpretation of her labs revealed a BMP without any electrolyte abnormality, creatinine level is within normal limits.  Hepatic function remarkable for slight elevation of her LFTs.  Does have any elevation in her blood cell count.  Beta hCG is negative.  Base level is within normal limits.  Xray of her chest showed: Basilar atelectasis including more bandlike subsegmental atelectasis  or scarring in the left basilar periphery. No consolidative opacity.     CT Abdomen showed: 1. No acute intra-abdominal process. Normal appendix.  These results were discussed at length with patient, she was provided with Pepcid to help with relief, some suspicion of component of GERD at this time.  She does show concern for combining medication with the current lithium that she takes, I have discussed this with pharmacist on duty who agreed Pepcid would be appropriate at this time.  We discussed going home with a short course of omeprazole to help with symptomatic control.  She does have a follow-up with her PCP as needed.  Patient remained stable during ED course, return precautions discussed at length.   Portions of this note were generated with [employer]. Dictation errors may occur despite best attempts at proofreading.  Final Clinical Impression(s) / ED Diagnoses Final diagnoses:  Atypical chest pain  Nausea  Right lower  quadrant abdominal pain    Rx / DC Orders ED Discharge Orders         Ordered    omeprazole (PRILOSEC) 20 MG capsule  Daily        07/15/20 1347           Janeece Fitting, PA-C 07/15/20 1359    Sherwood Gambler, MD 07/17/20 [phone]

## 2020-07-15 NOTE — Discharge Instructions (Addendum)
Your results are within normal limits today.  We discussed the CT of your abdomen at length.  I have prescribed medication to help treat  your likely Acid reflux, please take this as prescribed.  If you experience any worsening symptoms please return to the ED.

## 2020-07-15 NOTE — ED Triage Notes (Signed)
Pt sts waling up at 0430 with left sided chest pain that radiates down left arm. Pt also sts RLQ abdominal pain x 2-3 weeks.

## 2020-07-22 ENCOUNTER — Other Ambulatory Visit: Payer: 59

## 2020-08-07 ENCOUNTER — Ambulatory Visit
Admission: RE | Admit: 2020-08-07 | Discharge: 2020-08-07 | Disposition: A | Discharge status: Home / Self Care | Payer: 59 | Source: Ambulatory Visit | Attending: Obstetrics and Gynecology | Admitting: Obstetrics and Gynecology

## 2020-08-07 ENCOUNTER — Other Ambulatory Visit: Payer: Self-pay

## 2020-08-07 DIAGNOSIS — Z1231 Encounter for screening mammogram for malignant neoplasm of breast: Secondary | ICD-10-CM

## 2020-09-02 ENCOUNTER — Other Ambulatory Visit: Payer: Self-pay | Admitting: Gastroenterology

## 2020-09-02 ENCOUNTER — Other Ambulatory Visit (HOSPITAL_COMMUNITY): Payer: Self-pay | Admitting: Gastroenterology

## 2020-09-02 DIAGNOSIS — R1310 Dysphagia, unspecified: Secondary | ICD-10-CM

## 2020-09-08 ENCOUNTER — Ambulatory Visit (HOSPITAL_COMMUNITY)
Admission: RE | Admit: 2020-09-08 | Discharge: 2020-09-08 | Disposition: A | Discharge status: Home / Self Care | Payer: 59 | Source: Ambulatory Visit | Attending: Gastroenterology | Admitting: Gastroenterology

## 2020-09-08 ENCOUNTER — Other Ambulatory Visit: Payer: Self-pay

## 2020-09-08 DIAGNOSIS — R1310 Dysphagia, unspecified: Secondary | ICD-10-CM | POA: Insufficient documentation

## 2021-07-08 ENCOUNTER — Other Ambulatory Visit: Payer: Self-pay | Admitting: Obstetrics and Gynecology

## 2021-07-08 DIAGNOSIS — Z1231 Encounter for screening mammogram for malignant neoplasm of breast: Secondary | ICD-10-CM

## 2021-08-09 ENCOUNTER — Other Ambulatory Visit: Payer: Self-pay

## 2021-08-09 ENCOUNTER — Ambulatory Visit
Admission: RE | Admit: 2021-08-09 | Discharge: 2021-08-09 | Disposition: A | Discharge status: Home / Self Care | Payer: 59 | Source: Ambulatory Visit | Attending: Obstetrics and Gynecology | Admitting: Obstetrics and Gynecology

## 2021-08-09 DIAGNOSIS — Z1231 Encounter for screening mammogram for malignant neoplasm of breast: Secondary | ICD-10-CM

## 2022-04-18 NOTE — Progress Notes (Unsigned)
Cardiology Office Note   Date:  04/19/2022   ID:  Mariah Vaughn, DOB Dec 07, 1965, MRN [id number]  PCP:  Kathyrn Lass, MD    No chief complaint on file.  DOE  [facility] Readings from Last 3 Encounters:  04/19/22 182 lb (82.6 kg)  07/15/20 171 lb (77.6 kg)  04/09/20 174 lb 6.4 oz (79.1 kg)       History of Present Illness: Mariah Vaughn is a 56 y.o. female  who I saw in the past for Shortness of breath.  Prior records show: "Echo in 2018: "Left ventricle: The cavity size was normal. Systolic function was    normal. The estimated ejection fraction was in the range of 55%    to 60%. Wall motion was normal; there were no regional wall    motion abnormalities. Left ventricular diastolic function    parameters were normal.  - Aortic valve: Transvalvular velocity was within the normal range.    There was no stenosis. There was no regurgitation.  - Mitral valve: Transvalvular velocity was within the normal range.    There was no evidence for stenosis. There was trivial    regurgitation.  - Right ventricle: The cavity size was normal. Wall thickness was    normal. Systolic function was normal.  - Atrial septum: No defect or patent foramen ovale was identified    by color flow Doppler.  - Tricuspid valve: There was trivial regurgitation.  - Pulmonary arteries: Systolic pressure was within the normal    range. PA peak pressure: 20 mm Hg (S).   Mother has AFib.  Her sister has AFib.     Cannot take aspirin with lithium per her report."   In 2021, I saw her when her [employer] watch has told her that she was in atrial fibrillation and her heart rate was in the 120s.  She felt very tired.  No chest pain.   14-day monitor was done at that time in 2021: "Normal sinus rhythm with rare PACs, PVCs. Sinus tachycardia noted with activities like walking. No sustained pathologic arrhythmias."    She also had an echocardiogram showing: "Normal LV function and normal valvular function. "   She was trying to limit caffeine and sugary drinks to avoid mania or psychotic episodes.  2019 exercise stress test was normal.  Has tried to increase walking.  Still notes some DOE.  She has gained wieght in the past year.  She thinks lithium has played a part.   Denies : Leg edema. Nitroglycerin use. Orthopnea. Palpitations. Paroxysmal nocturnal dyspnea. Shortness of breath. Syncope.    She has had chest pain.  Worse with physical or emotional stress.  Walking in the house can cause CP.  Stable from a psych perspective.  Exercise with a trainer has been limited by the exertional chest pain.    Past Medical History:  Diagnosis Date   Allergic rhinitis    Anxiety    Asthma    Bipolar 2 disorder ([place])    Bulging lumbar disc    C SPINE   Depression    Gastritis    GANEM   History of palpitations    HX OF PAC'S AND HX OF PVC'S   Hx of smoking    Knee pain, right     Past Surgical History:  Procedure Laterality Date   COLONSCOPY  08/2010   ESOPHAGOGASTRODUODENOSCOPY  08/2010   ESOPHAGOGASTRODUODENOSCOPY  12/2014   SEPTOPLASTY  08/2010   TURBINATE REDUCTION  08/2010  WISDOM TOOTH EXTRACTION     AGE 88     Current Outpatient Medications  Medication Sig Dispense Refill   acetaminophen (TYLENOL) 500 MG tablet Take 1,000 mg by mouth every 6 (six) hours as needed for mild pain, fever or headache.     albuterol (PROVENTIL HFA;VENTOLIN HFA) 108 (90 Base) MCG/ACT inhaler Inhale 2 puffs into the lungs every 4 (four) hours as needed for wheezing or shortness of breath.     ARIPiprazole (ABILIFY) 5 MG tablet Take 2.5 mg by mouth daily.     citalopram (CELEXA) 10 MG tablet Take 2.5 mg by mouth daily. Take as directed.     fexofenadine (ALLEGRA) 180 MG tablet Take 180 mg by mouth daily as needed for allergies or rhinitis.     lithium carbonate (LITHOBID) 300 MG CR tablet Take 300 mg by mouth daily.     meclizine (ANTIVERT) 25 MG tablet SMARTSIG:1 Tablet(s) By Mouth Every 12 Hours PRN      acyclovir (ZOVIRAX) 400 MG tablet TAKE 1 TABLET BY MOUTH THREE TIMES DAILY FOR 10 DAYS AS NEEDED FOR OUTBREAK     Cholecalciferol (VITAMIN D3 PO) Take 1 capsule by mouth 2 (two) times a week. (Patient not taking: Reported on 04/19/2022)     estradiol (ESTRACE) 0.5 MG tablet Take 1 tablet by mouth daily.     LORazepam (ATIVAN) 0.5 MG tablet TAKE 1/2-1 TABLET BY MOUTH EVERY 4-6 HOURS AS NEEDED FOR SEVERE ANXIETY     omeprazole (PRILOSEC) 20 MG capsule Take 1 capsule (20 mg total) by mouth daily for 10 days. 10 capsule 0   ondansetron (ZOFRAN) 4 MG tablet      progesterone (PROMETRIUM) 100 MG capsule TAKE 1 CAPSULE BY MOUTH EVERY DAY FOR 12 DAYS     No current facility-administered medications for this visit.    Allergies:   Codeine, Oxycodone-acetaminophen, Sulfa antibiotics, Amoxicillin, Cefaclor, Cephalexin, Doxycycline, Doxycycline monohydrate, Elemental sulfur, Flonase [fluticasone propionate], Influenza vaccines, Other, Peg-kcl-nacl-nasulf-na asc-c, Penicillins, and Tamiflu [oseltamivir phosphate]    Social History:  The patient  reports that she quit smoking about 20 years ago. Her smoking use included cigarettes. She has a 39.00 pack-year smoking history. She has never used smokeless tobacco. She reports current alcohol use. She reports that she does not use drugs.   Family History:  The patient's family history includes ADD / ADHD in her son; Atrial fibrillation in her mother; Autism in her son; Breast cancer (age of onset: 56) in her mother; Lung cancer in her maternal grandmother; Other in her father; Ovarian cancer (age of onset: 12) in her sister; Pancreatitis in her father; Pneumonia in her father; Prostate cancer (age of onset: 52) in her paternal grandfather; Stroke in her father.    ROS:  Please see the history of present illness.   Otherwise, review of systems are positive for CP, DOE.   All other systems are reviewed and negative.    PHYSICAL EXAM: VS:  BP 106/70   Pulse 74    Ht 5' 3.5" (1.613 m)   Wt 182 lb (82.6 kg)   LMP  (LMP Unknown) Comment: negative HCG blood test 07-15-20  SpO2 98%   BMI 31.73 kg/m  , BMI Body mass index is 31.73 kg/m. GEN: Well nourished, well developed, in no acute distress HEENT: normal Neck: no JVD, carotid bruits, or masses Cardiac: RRR; no murmurs, rubs, or gallops,no edema  Respiratory:  clear to auscultation bilaterally, normal work of breathing GI: soft, nontender, nondistended, + BS MS:  no deformity or atrophy Skin: warm and dry, no rash Neuro:  Strength and sensation are intact Psych: euthymic mood, full affect   EKG:   The ekg ordered today demonstrates NSR, no ST changes   Recent Labs: No results found for requested labs within last 365 days.   Lipid Panel No results found for: "CHOL", "TRIG", "HDL", "CHOLHDL", "VLDL", "LDLCALC", "LDLDIRECT"   Other studies Reviewed: Additional studies/ records that were reviewed today with results demonstrating: prior echo and stress test reviewed.  LDL 131 in 2021.   ASSESSMENT AND PLAN:  Chest pain: Plan for CT angiogram of the coronary arteries to evaluate for coronary artery disease given the chest pain. Palpitations: Noted in the past.  Related to PACs and PVCs.  Stable at this time. No premature beats noted on ECG today.  Obesity: Whole food, plant-based diet.  Limit sugary drinks. DOE: THis has persisted.  Plan for CTA as noted above.  Once we have cleared the heart from a CAD standpoint, would feel free to go back to working with a trainer.   Current medicines are reviewed at length with the patient today.  The patient concerns regarding her medicines were addressed.  The following changes have been made:  No change  Labs/ tests ordered today include:  No orders of the defined types were placed in this encounter.   Recommend 150 minutes/week of aerobic exercise Low fat, low carb, high fiber diet recommended  Disposition:   FU for  CTA   Signed, Larae Grooms, MD  04/19/2022 9:48 AM    [facility] [place], [place], [place]  [address] Phone: [phone]; Fax: [phone]

## 2022-04-19 ENCOUNTER — Encounter: Payer: Self-pay | Admitting: Interventional Cardiology

## 2022-04-19 ENCOUNTER — Ambulatory Visit: Payer: Medicare Other | Attending: Interventional Cardiology | Admitting: Interventional Cardiology

## 2022-04-19 VITALS — BP 106/70 | HR 74 | Ht 63.5 in | Wt 182.0 lb

## 2022-04-19 DIAGNOSIS — R0609 Other forms of dyspnea: Secondary | ICD-10-CM

## 2022-04-19 DIAGNOSIS — R002 Palpitations: Secondary | ICD-10-CM

## 2022-04-19 DIAGNOSIS — I491 Atrial premature depolarization: Secondary | ICD-10-CM

## 2022-04-19 DIAGNOSIS — R072 Precordial pain: Secondary | ICD-10-CM

## 2022-04-19 DIAGNOSIS — I493 Ventricular premature depolarization: Secondary | ICD-10-CM | POA: Diagnosis not present

## 2022-04-19 MED ORDER — METOPROLOL TARTRATE 100 MG PO TABS: ORAL_TABLET | ORAL | 0 refills | Status: DC

## 2022-04-19 NOTE — Patient Instructions (Addendum)
Medication Instructions:  Your physician recommends that you continue on your current medications as directed. Please refer to the Current Medication list given to you today.  *If you need a refill on your cardiac medications before your next appointment, please call your pharmacy*   Lab Work: TODAY: BMET  If you have labs (blood work) drawn today and your tests are completely normal, you will receive your results only by: [place] (if you have MyChart) OR A paper copy in the mail If you have any lab test that is abnormal or we need to change your treatment, we will call you to review the results.   Testing/Procedures: Your physician has requested that you have cardiac CT.   Follow-Up: Based on test results  Other Instructions   Your cardiac CT will be scheduled at one of the below locations:   [facility] [address] [place], [place] [address] [phone]  [place] [address] [place], [place] [address] [phone]  [facility] [place], [place] [address] [phone]  If scheduled at [facility], please arrive at the [facility] and Children's Entrance (Entrance C2) of [facility] 30 minutes prior to test start time. You can use the FREE valet parking offered at entrance C (encouraged to control the heart rate for the test)  Proceed to the [facility] Radiology Department (first floor) to check-in and test prep.  All radiology patients and guests should use entrance C2 at [facility], accessed from [facility], even though the hospital's physical address listed is [address]    If scheduled at [facility] or [facility], please arrive 15 mins early for check-in and test prep.   Please follow these instructions carefully (unless otherwise  directed):    On the Night Before the Test: Be sure to Drink plenty of water. Do not consume any caffeinated/decaffeinated beverages or chocolate 12 hours prior to your test. Do not take any antihistamines 12 hours prior to your test.  On the Day of the Test: Drink plenty of water until 1 hour prior to the test. Do not eat any food 1 hour prior to test. You may take your regular medications prior to the test.  Take metoprolol (Lopressor) 100 MG two hours prior to test.        After the Test: Drink plenty of water. After receiving IV contrast, you may experience a mild flushed feeling. This is normal. On occasion, you may experience a mild rash up to 24 hours after the test. This is not dangerous. If this occurs, you can take Benadryl 25 mg and increase your fluid intake. If you experience trouble breathing, this can be serious. If it is severe call [phone] IMMEDIATELY. If it is mild, please call our office.  We will call to schedule your test 2-4 weeks out understanding that some insurance companies will need an authorization prior to the service being performed.   For non-scheduling related questions, please contact the cardiac imaging nurse navigator should you have any questions/concerns: Marchia Bond, Cardiac Imaging Nurse Navigator Gordy Clement, Cardiac Imaging Nurse Navigator [facility] Direct Office Dial: [phone]   For scheduling needs, including cancellations and rescheduling, please call [place], [phone].   Important Information About Sugar

## 2022-04-28 ENCOUNTER — Telehealth (HOSPITAL_COMMUNITY): Payer: Self-pay | Admitting: Emergency Medicine

## 2022-04-28 NOTE — Telephone Encounter (Signed)
Reaching out to patient to offer assistance regarding upcoming cardiac imaging study; pt verbalizes understanding of appt date/time, parking situation and where to check in, pre-test NPO status and medications ordered, and verified current allergies; name and call back number provided for further questions should they arise Marchia Bond RN Navigator Cardiac Imaging [facility] [phone] office [phone] cell   Arrival 1230 '100mg'$  metop + antianxiety med for anxiety

## 2022-04-29 ENCOUNTER — Ambulatory Visit (HOSPITAL_COMMUNITY)
Admission: RE | Admit: 2022-04-29 | Discharge: 2022-04-29 | Disposition: A | Discharge status: Home / Self Care | Payer: Medicare Other | Source: Ambulatory Visit | Attending: Interventional Cardiology | Admitting: Interventional Cardiology

## 2022-04-29 DIAGNOSIS — R002 Palpitations: Secondary | ICD-10-CM | POA: Diagnosis present

## 2022-04-29 DIAGNOSIS — I491 Atrial premature depolarization: Secondary | ICD-10-CM | POA: Diagnosis not present

## 2022-04-29 DIAGNOSIS — R072 Precordial pain: Secondary | ICD-10-CM | POA: Diagnosis not present

## 2022-04-29 DIAGNOSIS — I493 Ventricular premature depolarization: Secondary | ICD-10-CM | POA: Insufficient documentation

## 2022-04-29 DIAGNOSIS — R0609 Other forms of dyspnea: Secondary | ICD-10-CM | POA: Insufficient documentation

## 2022-04-29 MED ORDER — IOHEXOL 350 MG/ML SOLN
100.0000 mL | Freq: Once | INTRAVENOUS | Status: AC | PRN
  Administered 2022-04-29: 100 mL via INTRAVENOUS

## 2022-04-29 MED ORDER — NITROGLYCERIN 0.4 MG SL SUBL
0.8000 mg | SUBLINGUAL_TABLET | Freq: Once | SUBLINGUAL | Status: AC
  Administered 2022-04-29: 0.8 mg via SUBLINGUAL

## 2022-04-29 MED ORDER — METOPROLOL TARTRATE 5 MG/5ML IV SOLN
5.0000 mg | Freq: Once | INTRAVENOUS | Status: AC
  Administered 2022-04-29: 5 mg via INTRAVENOUS

## 2022-04-29 MED ORDER — NITROGLYCERIN 0.4 MG SL SUBL
SUBLINGUAL_TABLET | SUBLINGUAL | Status: AC
  Filled 2022-04-29: qty 2

## 2022-04-29 MED ORDER — METOPROLOL TARTRATE 5 MG/5ML IV SOLN
INTRAVENOUS | Status: AC
  Filled 2022-04-29: qty 10

## 2022-06-14 ENCOUNTER — Ambulatory Visit: Payer: Medicare Other | Admitting: Plastic Surgery

## 2022-06-14 VITALS — Ht 63.5 in | Wt 184.0 lb

## 2022-06-14 DIAGNOSIS — N62 Hypertrophy of breast: Secondary | ICD-10-CM

## 2022-06-15 NOTE — Progress Notes (Signed)
Left message

## 2022-06-29 ENCOUNTER — Encounter: Payer: Self-pay | Admitting: Plastic Surgery

## 2022-06-29 ENCOUNTER — Ambulatory Visit (INDEPENDENT_AMBULATORY_CARE_PROVIDER_SITE_OTHER): Payer: Medicare Other | Admitting: Plastic Surgery

## 2022-06-29 VITALS — BP 132/77 | HR 77 | Ht 63.5 in | Wt 183.4 lb

## 2022-06-29 DIAGNOSIS — M546 Pain in thoracic spine: Secondary | ICD-10-CM | POA: Diagnosis not present

## 2022-06-29 DIAGNOSIS — M542 Cervicalgia: Secondary | ICD-10-CM

## 2022-06-29 DIAGNOSIS — G8929 Other chronic pain: Secondary | ICD-10-CM

## 2022-06-29 DIAGNOSIS — Z803 Family history of malignant neoplasm of breast: Secondary | ICD-10-CM

## 2022-06-29 DIAGNOSIS — N62 Hypertrophy of breast: Secondary | ICD-10-CM

## 2022-06-29 NOTE — Progress Notes (Signed)
   Subjective:    Patient ID: Mariah Vaughn, female    DOB: May 29, 1965, 57 y.o.   MRN: [id number]  The patient is a 57 year old female here for reevaluation of mammary hyperplasia.  I have seen her in the past for the same issue.  We had some challenges with insurance.  She complains of back pain and neck pain.  She has grooving in her shoulders from her bra.  She finds some relief by holding her breasts manually and over-the-counter pain meds.  She still is interested in a breast reduction.  She feels that the excess weight from her breast inhibits her from being more active.  She would like to run and do exercises.  She is fairly symmetric.  She does have some hyperpigmentation at her inframammary folds.  She is 5 feet 3 inches tall and weighs 183 pounds.  This is a 20 pound weight gain since her last visit.  Her sternal to nipple distance is 30 cm on the right and 31 cm on the left.  She would like to be around a B/C cup.  The estimated amount of tissue to be removed is 550 g from each side.  She does have a family history of breast cancer and was tested for BRCA and was negative.    Review of Systems  Constitutional:  Positive for activity change. Negative for appetite change.  Eyes: Negative.   Respiratory: Negative.  Negative for chest tightness and shortness of breath.   Cardiovascular: Negative.   Gastrointestinal: Negative.   Endocrine: Negative.   Genitourinary: Negative.   Musculoskeletal:  Positive for back pain.  Neurological: Negative.   Hematological: Negative.        Objective:   Physical Exam Vitals reviewed.  Constitutional:      Appearance: Normal appearance.  Cardiovascular:     Rate and Rhythm: Normal rate.     Pulses: Normal pulses.  Pulmonary:     Effort: Pulmonary effort is normal.  Abdominal:     General: There is no distension.     Palpations: Abdomen is soft.  Skin:    General: Skin is warm.     Capillary Refill: Capillary refill takes less than 2  seconds.     Coloration: Skin is not jaundiced.  Neurological:     Mental Status: She is alert and oriented to person, place, and time.  Psychiatric:        Mood and Affect: Mood normal.        Behavior: Behavior normal.         Assessment & Plan:     ICD-10-CM   1. Symptomatic mammary hypertrophy  N62     2. Chronic bilateral thoracic back pain  M54.6    G89.29     3. Neck pain  M54.2       Pictures were obtained of the patient and placed in the chart with the patient's or guardian's permission.  The patient is still interested in bilateral breast reductions.  She is willing to try the physical therapy especially if insurance requires it.  She also is being seen for a left rotator cuff injury and right sided broken ribs.  She will follow-up with [place] in the next 3 to 4 months for reevaluation.  She also was involved in activity and is trying to lose weight which she should do prior to surgery

## 2022-07-27 ENCOUNTER — Other Ambulatory Visit: Payer: Self-pay | Admitting: Obstetrics and Gynecology

## 2022-07-27 DIAGNOSIS — Z1231 Encounter for screening mammogram for malignant neoplasm of breast: Secondary | ICD-10-CM

## 2022-08-04 ENCOUNTER — Encounter: Payer: Self-pay | Admitting: Pulmonary Disease

## 2022-08-04 ENCOUNTER — Ambulatory Visit (INDEPENDENT_AMBULATORY_CARE_PROVIDER_SITE_OTHER): Payer: Medicare Other | Admitting: Pulmonary Disease

## 2022-08-04 VITALS — BP 120/80 | HR 90 | Ht 63.0 in | Wt 181.4 lb

## 2022-08-04 DIAGNOSIS — J4540 Moderate persistent asthma, uncomplicated: Secondary | ICD-10-CM | POA: Diagnosis not present

## 2022-08-04 DIAGNOSIS — R0789 Other chest pain: Secondary | ICD-10-CM

## 2022-08-04 DIAGNOSIS — R062 Wheezing: Secondary | ICD-10-CM

## 2022-08-04 MED ORDER — FLUTICASONE FUROATE-VILANTEROL 200-25 MCG/ACT IN AEPB: 1.0000 | INHALATION_SPRAY | Freq: Every day | RESPIRATORY_TRACT | 2 refills | Status: DC

## 2022-08-04 NOTE — Patient Instructions (Signed)
Thank you for visiting Dr. Valeta Harms at [facility]. Today we recommend the following:  Orders Placed This Encounter  Procedures   Pulmonary Function Test   Meds ordered this encounter  Medications   fluticasone furoate-vilanterol (BREO ELLIPTA) 200-25 MCG/ACT AEPB    Sig: Inhale 1 puff into the lungs daily.    Dispense:  60 each    Refill:  2   Return in about 6 weeks (around 09/15/2022) for with APP.    Please do your part to reduce the spread of COVID-19.

## 2022-08-04 NOTE — Progress Notes (Signed)
Synopsis: Referred in March 2024 for asthma/sob by Kathyrn Lass, MD  Subjective:   PATIENT ID: Mariah Vaughn GENDER: female DOB: 09/06/1965, MRN: [id number]  Chief Complaint  Patient presents with   Consult    SOB, coughing, wheezing    PMH of asthma, started smoking at age 57, and smoked for 35 years. And she had frequent episodes of bronchitis.  From respiratory standpoint she is doing okay.  She does have shortness of breath with exertion.  She has allergies to dogs and cats at both which are in her home.  She uses her albuterol occasionally that does improve her symptoms but sometimes this makes her chest feel tight and that she cannot catch her breath.  She has not been on a maintenance inhaler in a long time.  At 1 point in time she used Advair.  And she recently was given prescription for Symbicort but it was $80 and she did not pick this up from the pharmacy.    Past Medical History:  Diagnosis Date   Allergic rhinitis    Anxiety    Asthma    Bipolar 2 disorder (HCC)    Bulging lumbar disc    C SPINE   Depression    Gastritis    GANEM   History of palpitations    HX OF PAC'S AND HX OF PVC'S   Hx of smoking    Knee pain, right      Family History  Problem Relation Age of Onset   Breast cancer Mother 8   Atrial fibrillation Mother    Ovarian cancer Sister 36       maternal half sister   Lung cancer Maternal Grandmother        worked at [employer]   Prostate cancer Paternal Grandfather 24   Stroke Father    Pancreatitis Father    Pneumonia Father    Other Father        RESP FAILURE   ADD / ADHD Son    Autism Son        SENSORY PROCESSING DISORDER     Past Surgical History:  Procedure Laterality Date   COLONSCOPY  08/2010   ESOPHAGOGASTRODUODENOSCOPY  08/2010   ESOPHAGOGASTRODUODENOSCOPY  12/2014   SEPTOPLASTY  08/2010   TURBINATE REDUCTION  08/2010   WISDOM TOOTH EXTRACTION     AGE 57    Social History   Socioeconomic History   Marital  status: Legally Separated    Spouse name: Not on file   Number of children: Not on file   Years of education: Not on file   Highest education level: Not on file  Occupational History   Not on file  Tobacco Use   Smoking status: Former    Packs/day: 1.50    Years: 26.00    Additional pack years: 0.00    Total pack years: 39.00    Types: Cigarettes    Quit date: 05/23/2001    Years since quitting: 21.2   Smokeless tobacco: Never  Vaping Use   Vaping Use: Never used  Substance and Sexual Activity   Alcohol use: Yes    Comment: rarely   Drug use: No   Sexual activity: Yes  Other Topics Concern   Not on file  Social History Narrative   Not on file   Social Determinants of Health   Financial Resource Strain: Not on file  Food Insecurity: Not on file  Transportation Needs: Not on file  Physical Activity: Not on  file  Stress: Not on file  Social Connections: Not on file  Intimate Partner Violence: Not on file     Allergies  Allergen Reactions   Amoxicillin Shortness Of Breath, Itching and Rash   Codeine Nausea And Vomiting   Influenza Vaccines Shortness Of Breath   Oxycodone-Acetaminophen Nausea And Vomiting   Penicillins Shortness Of Breath, Itching and Rash   Sulfa Antibiotics Anaphylaxis and Other (See Comments)   Cephalexin Itching   Doxycycline     Stomach upset    Doxycycline Monohydrate Other (See Comments)   Elemental Sulfur    Flonase [Fluticasone Propionate]     headaches   Other Other (See Comments)   Peg-Kcl-Nacl-Nasulf-Na Asc-C Other (See Comments)    dehydration   Tamiflu [Oseltamivir Phosphate]     STOMACH UPSET   Cefaclor Itching and Rash    Shortness of breath     Outpatient Medications Prior to Visit  Medication Sig Dispense Refill   acetaminophen (TYLENOL) 500 MG tablet Take 1,000 mg by mouth every 6 (six) hours as needed for mild pain, fever or headache.     acyclovir (ZOVIRAX) 400 MG tablet TAKE 1 TABLET BY MOUTH THREE TIMES DAILY FOR 10  DAYS AS NEEDED FOR OUTBREAK     albuterol (PROVENTIL HFA;VENTOLIN HFA) 108 (90 Base) MCG/ACT inhaler Inhale 2 puffs into the lungs every 4 (four) hours as needed for wheezing or shortness of breath.     ARIPiprazole (ABILIFY) 5 MG tablet Take 2.5 mg by mouth daily.     cetirizine (ZYRTEC ALLERGY) 10 MG tablet Take 10 mg by mouth daily.     Cholecalciferol (VITAMIN D3 PO) Take 1 capsule by mouth 2 (two) times a week.     citalopram (CELEXA) 10 MG tablet Take 2.5 mg by mouth daily. Take as directed.     fexofenadine (ALLEGRA) 180 MG tablet Take 180 mg by mouth daily as needed for allergies or rhinitis.     fluticasone (FLONASE SENSIMIST) 27.5 MCG/SPRAY nasal spray 1 spray in each nostril Nasally Once a day for 30 days     lithium carbonate (LITHOBID) 300 MG CR tablet Take 300 mg by mouth 2 (two) times daily.     LORazepam (ATIVAN) 0.5 MG tablet TAKE 1/2-1 TABLET BY MOUTH EVERY 4-6 HOURS AS NEEDED FOR SEVERE ANXIETY     meclizine (ANTIVERT) 25 MG tablet SMARTSIG:1 Tablet(s) By Mouth Every 12 Hours PRN     methocarbamol (ROBAXIN) 750 MG tablet Take 750 mg by mouth every 6 (six) hours as needed.     ondansetron (ZOFRAN) 4 MG tablet      omeprazole (PRILOSEC) 20 MG capsule Take 1 capsule (20 mg total) by mouth daily for 10 days. 10 capsule 0   No facility-administered medications prior to visit.    Review of Systems  Constitutional:  Negative for chills, fever, malaise/fatigue and weight loss.  HENT:  Negative for hearing loss, sore throat and tinnitus.   Eyes:  Negative for blurred vision and double vision.  Respiratory:  Positive for shortness of breath and wheezing. Negative for cough, hemoptysis, sputum production and stridor.   Cardiovascular:  Negative for chest pain, palpitations, orthopnea, leg swelling and PND.  Gastrointestinal:  Negative for abdominal pain, constipation, diarrhea, heartburn, nausea and vomiting.  Genitourinary:  Negative for dysuria, hematuria and urgency.   Musculoskeletal:  Negative for joint pain and myalgias.  Skin:  Negative for itching and rash.  Neurological:  Negative for dizziness, tingling, weakness and headaches.  Endo/Heme/Allergies:  Negative  for environmental allergies. Does not bruise/bleed easily.  Psychiatric/Behavioral:  Negative for depression. The patient is not nervous/anxious and does not have insomnia.   All other systems reviewed and are negative.    Objective:  Physical Exam Vitals reviewed.  Constitutional:      General: She is not in acute distress.    Appearance: She is well-developed. She is obese.  HENT:     Head: Normocephalic and atraumatic.  Eyes:     General: No scleral icterus.    Conjunctiva/sclera: Conjunctivae normal.     Pupils: Pupils are equal, round, and reactive to light.  Neck:     Vascular: No JVD.     Trachea: No tracheal deviation.  Cardiovascular:     Rate and Rhythm: Normal rate and regular rhythm.     Heart sounds: Normal heart sounds. No murmur heard. Pulmonary:     Effort: Pulmonary effort is normal. No tachypnea, accessory muscle usage or respiratory distress.     Breath sounds: No stridor. No wheezing, rhonchi or rales.  Abdominal:     General: There is no distension.     Palpations: Abdomen is soft.     Tenderness: There is no abdominal tenderness.  Musculoskeletal:        General: No tenderness.     Cervical back: Neck supple.  Lymphadenopathy:     Cervical: No cervical adenopathy.  Skin:    General: Skin is warm and dry.     Capillary Refill: Capillary refill takes less than 2 seconds.     Findings: No rash.  Neurological:     Mental Status: She is alert and oriented to person, place, and time.  Psychiatric:        Behavior: Behavior normal.      Vitals:   08/04/22 1433  BP: 120/80  Pulse: 90  SpO2: 98%  Weight: 181 lb 6.4 oz (82.3 kg)  Height: '5\' 3"'$  (1.6 m)   98% on RA BMI Readings from Last 3 Encounters:  08/04/22 32.13 kg/m  06/29/22 31.98 kg/m   06/14/22 32.08 kg/m   Wt Readings from Last 3 Encounters:  08/04/22 181 lb 6.4 oz (82.3 kg)  06/29/22 183 lb 6.4 oz (83.2 kg)  06/14/22 184 lb (83.5 kg)     CBC    Component Value Date/Time   WBC 6.0 07/15/2020 0512   RBC 4.73 07/15/2020 0512   HGB 13.5 07/15/2020 0512   HCT 42.0 07/15/2020 0512   PLT 300 07/15/2020 0512   MCV 88.8 07/15/2020 0512   MCH 28.5 07/15/2020 0512   MCHC 32.1 07/15/2020 0512   RDW 12.4 07/15/2020 0512   LYMPHSABS 1.1 08/23/2010 0310   MONOABS 0.4 08/23/2010 0310   EOSABS 0.1 08/23/2010 0310   BASOSABS 0.0 08/23/2010 0310     Chest Imaging: No recent axial imaging of the chest.   Pulmonary Functions Testing Results:     No data to display          FeNO:   Pathology:   Echocardiogram:   Heart Catheterization:     Assessment & Plan:     ICD-10-CM   1. Moderate persistent asthma without complication  123456 fluticasone furoate-vilanterol (BREO ELLIPTA) 200-25 MCG/ACT AEPB    Pulmonary Function Test    2. Chest tightness  R07.89     3. Wheezing  R06.2       Discussion:  This is a 57 year old female with known allergies on antihistamines.  Likely has moderate persistent asthma symptoms.  Not on  maintenance inhaler.  Plan: Start Breo 200 Continue albuterol as needed. Continue Flonase nasal spray, continue antihistamines with Allegra daily Pulmonary function test prior to next office visit.  Repeat appointment in approximately 6 weeks after PFTs complete.   Current Outpatient Medications:    acetaminophen (TYLENOL) 500 MG tablet, Take 1,000 mg by mouth every 6 (six) hours as needed for mild pain, fever or headache., Disp: , Rfl:    acyclovir (ZOVIRAX) 400 MG tablet, TAKE 1 TABLET BY MOUTH THREE TIMES DAILY FOR 10 DAYS AS NEEDED FOR OUTBREAK, Disp: , Rfl:    albuterol (PROVENTIL HFA;VENTOLIN HFA) 108 (90 Base) MCG/ACT inhaler, Inhale 2 puffs into the lungs every 4 (four) hours as needed for wheezing or shortness of  breath., Disp: , Rfl:    ARIPiprazole (ABILIFY) 5 MG tablet, Take 2.5 mg by mouth daily., Disp: , Rfl:    cetirizine (ZYRTEC ALLERGY) 10 MG tablet, Take 10 mg by mouth daily., Disp: , Rfl:    Cholecalciferol (VITAMIN D3 PO), Take 1 capsule by mouth 2 (two) times a week., Disp: , Rfl:    citalopram (CELEXA) 10 MG tablet, Take 2.5 mg by mouth daily. Take as directed., Disp: , Rfl:    fexofenadine (ALLEGRA) 180 MG tablet, Take 180 mg by mouth daily as needed for allergies or rhinitis., Disp: , Rfl:    fluticasone (FLONASE SENSIMIST) 27.5 MCG/SPRAY nasal spray, 1 spray in each nostril Nasally Once a day for 30 days, Disp: , Rfl:    fluticasone furoate-vilanterol (BREO ELLIPTA) 200-25 MCG/ACT AEPB, Inhale 1 puff into the lungs daily., Disp: 60 each, Rfl: 2   lithium carbonate (LITHOBID) 300 MG CR tablet, Take 300 mg by mouth 2 (two) times daily., Disp: , Rfl:    LORazepam (ATIVAN) 0.5 MG tablet, TAKE 1/2-1 TABLET BY MOUTH EVERY 4-6 HOURS AS NEEDED FOR SEVERE ANXIETY, Disp: , Rfl:    meclizine (ANTIVERT) 25 MG tablet, SMARTSIG:1 Tablet(s) By Mouth Every 12 Hours PRN, Disp: , Rfl:    methocarbamol (ROBAXIN) 750 MG tablet, Take 750 mg by mouth every 6 (six) hours as needed., Disp: , Rfl:    ondansetron (ZOFRAN) 4 MG tablet, , Disp: , Rfl:    omeprazole (PRILOSEC) 20 MG capsule, Take 1 capsule (20 mg total) by mouth daily for 10 days., Disp: 10 capsule, Rfl: 0    Garner Nash, DO [facility] 08/04/2022 3:12 PM

## 2022-09-14 ENCOUNTER — Ambulatory Visit: Payer: Medicare Other

## 2022-09-15 ENCOUNTER — Ambulatory Visit (INDEPENDENT_AMBULATORY_CARE_PROVIDER_SITE_OTHER): Payer: Medicare Other | Admitting: Nurse Practitioner

## 2022-09-15 ENCOUNTER — Ambulatory Visit (INDEPENDENT_AMBULATORY_CARE_PROVIDER_SITE_OTHER): Payer: Medicare Other | Admitting: Pulmonary Disease

## 2022-09-15 ENCOUNTER — Encounter: Payer: Self-pay | Admitting: Nurse Practitioner

## 2022-09-15 VITALS — BP 118/84 | HR 93 | Temp 97.8°F | Ht 63.0 in | Wt 179.4 lb

## 2022-09-15 DIAGNOSIS — J4540 Moderate persistent asthma, uncomplicated: Secondary | ICD-10-CM

## 2022-09-15 DIAGNOSIS — G4733 Obstructive sleep apnea (adult) (pediatric): Secondary | ICD-10-CM

## 2022-09-15 LAB — PULMONARY FUNCTION TEST
DL/VA % pred: 85 %
DL/VA: 3.65 ml/min/mmHg/L
DLCO cor % pred: 87 %
DLCO cor: 17.77 ml/min/mmHg
DLCO unc % pred: 87 %
DLCO unc: 17.77 ml/min/mmHg
FEF 25-75 Post: 2.66 L/sec
FEF 25-75 Pre: 2.15 L/sec
FEF2575-%Change-Post: 23 %
FEF2575-%Pred-Post: 103 %
FEF2575-%Pred-Pre: 83 %
FEV1-%Change-Post: 5 %
FEV1-%Pred-Post: 94 %
FEV1-%Pred-Pre: 89 %
FEV1-Post: 2.52 L
FEV1-Pre: 2.4 L
FEV1FVC-%Change-Post: 1 %
FEV1FVC-%Pred-Pre: 98 %
FEV6-%Change-Post: 2 %
FEV6-%Pred-Post: 95 %
FEV6-%Pred-Pre: 93 %
FEV6-Post: 3.14 L
FEV6-Pre: 3.07 L
FEV6FVC-%Change-Post: 0 %
FEV6FVC-%Pred-Post: 103 %
FEV6FVC-%Pred-Pre: 103 %
FVC-%Change-Post: 3 %
FVC-%Pred-Post: 93 %
FVC-%Pred-Pre: 90 %
FVC-Post: 3.16 L
FVC-Pre: 3.07 L
Post FEV1/FVC ratio: 80 %
Post FEV6/FVC ratio: 100 %
Pre FEV1/FVC ratio: 78 %
Pre FEV6/FVC Ratio: 100 %
RV % pred: 105 %
RV: 1.95 L
TLC % pred: 105 %
TLC: 5.24 L

## 2022-09-15 NOTE — Progress Notes (Signed)
Full PFT performed today. °

## 2022-09-15 NOTE — Assessment & Plan Note (Signed)
Asthma with allergic phenotype. Compensated on current regimen. Clinically improved with ICS/LABA therapy. PFTs today were normal with midflow reversibility. She will continue antihistamine for trigger prevention. Asthma action plan in place.  Patient Instructions  Continue Albuterol inhaler 2 puffs  every 6 hours as needed for shortness of breath or wheezing Continue Breo 1 puff daily. Brush tongue and rinse mouth afterwards Continue allegra or zyrtec 1 tab daily for allergies Continue flonase nasal spray 2 sprays each nostril daily   Asthma action plan: START inhaler up to six times a day for worsening shortness of breath, wheezing and cough. If you symptoms do not improve in 24-48 hours, contact us for steroid course. If your symptoms rapidly worsen, you have trouble talking or extreme difficulty breathing, or you're not getting relief from your nebulizer/rescue, go to the emergency department.  Follow up in 4 months with Dr. Tonia Brooms or Philis Nettle. If symptoms do not improve or worsen, please contact office for sooner follow up or seek emergency care.

## 2022-09-15 NOTE — Patient Instructions (Signed)
Full PFT performed today. °

## 2022-09-15 NOTE — Patient Instructions (Addendum)
Continue Albuterol inhaler 2 puffs  every 6 hours as needed for shortness of breath or wheezing Continue Breo 1 puff daily. Brush tongue and rinse mouth afterwards Continue allegra or zyrtec 1 tab daily for allergies Continue flonase nasal spray 2 sprays each nostril daily   Asthma action plan: START inhaler up to six times a day for worsening shortness of breath, wheezing and cough. If you symptoms do not improve in 24-48 hours, contact us for steroid course. If your symptoms rapidly worsen, you have trouble talking or extreme difficulty breathing, or you're not getting relief from your nebulizer/rescue, go to the emergency department.  Follow up in 4 months with Dr. Tonia Brooms or Philis Nettle. If symptoms do not improve or worsen, please contact office for sooner follow up or seek emergency care.

## 2022-09-15 NOTE — Progress Notes (Signed)
  ID: Mariah Vaughn, female    DOB: 04/03/1966, 57 y.o.   MRN: [id number]  Chief Complaint  Patient presents with   Follow-up    Pft review,     Referring provider: Sigmund Hazel, MD  HPI: 57 year old female, former smoker followed for asthma. She is a patient of Dr. Myrlene Broker and last seen in office 08/04/2022. Past medical history significant for allergic rhinitis, anxiety, depression/bipolar.  TEST/EVENTS:  09/20/2016 CTA chest: No PE. Lungs are clear.  09/2016 allergen panel: IgE 343, positive to ragweed, dust, cat, dog  09/15/2022 PFT: FVC 90, FEV1 89, ratio 80, TLC 105, DLCOcor 87. No BD  08/04/2022: OV with Dr. Tonia Brooms. Started smoking at age 31. Smoked for 35 years. Frequent episodes of bronchitis. From a respiratory standpoint she is doing okay. Allergies to dogs and cats, both of which are in her home. She has not been on maintenance inhaler in a long time. Likely has persistent asthma. Schedule PFTs. Start Breo and Careers adviser.   09/15/2022: Today - follow up Patient presents today for follow up after PFTs, which were normal. She did have some midflow reversibility. She tells me she has been doing much better with Breo. She can walk up a hill without getting winded now. Has been able to exercise and complete activities without difficulty. Not noticing any wheezing. She hasn't had to use her rescue inhaler. Feeling good. No concerns or complaints today. Switches between zyrtec and allegra for allergies, which seems to work well.   Allergies  Allergen Reactions   Amoxicillin Shortness Of Breath, Itching and Rash   Codeine Nausea And Vomiting   Influenza Vaccines Shortness Of Breath   Oxycodone-Acetaminophen Nausea And Vomiting   Penicillins Shortness Of Breath, Itching and Rash   Sulfa Antibiotics Anaphylaxis and Other (See Comments)   Cephalexin Itching   Doxycycline     Stomach upset    Doxycycline Monohydrate Other (See Comments)   Elemental Sulfur    Flonase [Fluticasone  Propionate]     headaches   Other Other (See Comments)   Peg-Kcl-Nacl-Nasulf-Na Asc-C Other (See Comments)    dehydration   Tamiflu [Oseltamivir Phosphate]     STOMACH UPSET   Cefaclor Itching and Rash    Shortness of breath     There is no immunization history on file for this patient.  Past Medical History:  Diagnosis Date   Allergic rhinitis    Anxiety    Asthma    Bipolar 2 disorder    Bulging lumbar disc    C SPINE   Depression    Gastritis    GANEM   History of palpitations    HX OF PAC'S AND HX OF PVC'S   Hx of smoking    Knee pain, right     Tobacco History: Social History   Tobacco Use  Smoking Status Former   Packs/day: 1.50   Years: 26.00   Additional pack years: 0.00   Total pack years: 39.00   Types: Cigarettes   Quit date: 05/23/2001   Years since quitting: 21.3  Smokeless Tobacco Never   Counseling given: Not Answered   Outpatient Medications Prior to Visit  Medication Sig Dispense Refill   acetaminophen (TYLENOL) 500 MG tablet Take 1,000 mg by mouth every 6 (six) hours as needed for mild pain, fever or headache.     acyclovir (ZOVIRAX) 400 MG tablet TAKE 1 TABLET BY MOUTH THREE TIMES DAILY FOR 10 DAYS AS NEEDED FOR OUTBREAK  albuterol (PROVENTIL HFA;VENTOLIN HFA) 108 (90 Base) MCG/ACT inhaler Inhale 2 puffs into the lungs every 4 (four) hours as needed for wheezing or shortness of breath.     ARIPiprazole (ABILIFY) 5 MG tablet Take 2.5 mg by mouth daily.     cetirizine (ZYRTEC ALLERGY) 10 MG tablet Take 10 mg by mouth daily.     Cholecalciferol (VITAMIN D3 PO) Take 1 capsule by mouth 2 (two) times a week.     citalopram (CELEXA) 10 MG tablet Take 2.5 mg by mouth daily. Take as directed.     fexofenadine (ALLEGRA) 180 MG tablet Take 180 mg by mouth daily as needed for allergies or rhinitis.     fluticasone (FLONASE SENSIMIST) 27.5 MCG/SPRAY nasal spray 1 spray in each nostril Nasally Once a day for 30 days     fluticasone furoate-vilanterol  (BREO ELLIPTA) 200-25 MCG/ACT AEPB Inhale 1 puff into the lungs daily. 60 each 2   lithium carbonate (LITHOBID) 300 MG CR tablet Take 300 mg by mouth 2 (two) times daily.     LORazepam (ATIVAN) 0.5 MG tablet TAKE 1/2-1 TABLET BY MOUTH EVERY 4-6 HOURS AS NEEDED FOR SEVERE ANXIETY     meclizine (ANTIVERT) 25 MG tablet SMARTSIG:1 Tablet(s) By Mouth Every 12 Hours PRN     methocarbamol (ROBAXIN) 750 MG tablet Take 750 mg by mouth every 6 (six) hours as needed.     ondansetron (ZOFRAN) 4 MG tablet      omeprazole (PRILOSEC) 20 MG capsule Take 1 capsule (20 mg total) by mouth daily for 10 days. 10 capsule 0   No facility-administered medications prior to visit.     Review of Systems:   Constitutional: No weight loss or gain, night sweats, fevers, chills, fatigue, or lassitude. HEENT: No headaches, difficulty swallowing, tooth/dental problems, or sore throat. No sneezing, itching, ear ache +occasional nasal congestion, post nasal drip CV:  No chest pain, orthopnea, PND, swelling in lower extremities, anasarca, dizziness, palpitations, syncope Resp: No shortness of breath with exertion or at rest. No excess mucus or change in color of mucus. No productive or non-productive. No hemoptysis. No wheezing.  No chest wall deformity GI:  No heartburn, indigestion, abdominal pain, nausea, vomiting, diarrhea, change in bowel habits, loss of appetite, bloody stools.  GU: No dysuria, change in color of urine, urgency or frequency.  Skin: No rash, lesions, ulcerations MSK:  No joint pain or swelling.   Neuro: No dizziness or lightheadedness.  Psych: No depression or anxiety. Mood stable.     Physical Exam:  BP 118/84   Pulse 93   Temp 97.8 F (36.6 C)   Ht 5\' 3"  (1.6 m)   Wt 179 lb 6.4 oz (81.4 kg)   LMP  (LMP Unknown) Comment: negative HCG blood test 07-15-20  SpO2 98%   BMI 31.78 kg/m   GEN: Pleasant, interactive, well-appearing; obese; in no acute distress. HEENT:  Normocephalic and  atraumatic. PERRLA. Sclera white. Nasal turbinates pink, moist and patent bilaterally. No rhinorrhea present. Oropharynx pink and moist, without exudate or edema. No lesions, ulcerations, or postnasal drip.  NECK:  Supple w/ fair ROM. No JVD present. Normal carotid impulses w/o bruits. Thyroid symmetrical with no goiter or nodules palpated. No lymphadenopathy.   CV: RRR, no m/r/g, no peripheral edema. Pulses intact, +2 bilaterally. No cyanosis, pallor or clubbing. PULMONARY:  Unlabored, regular breathing. Clear bilaterally A&P w/o wheezes/rales/rhonchi. No accessory muscle use.  GI: BS present and normoactive. Soft, non-tender to palpation. No organomegaly or masses detected.  MSK: No erythema, warmth or tenderness. Cap refil <2 sec all extrem. No deformities or joint swelling noted.  Neuro: A/Ox3. No focal deficits noted.   Skin: Warm, no lesions or rashe Psych: Normal affect and behavior. Judgement and thought content appropriate.     Lab Results:  CBC    Component Value Date/Time   WBC 6.0 07/15/2020 0512   RBC 4.73 07/15/2020 0512   HGB 13.5 07/15/2020 0512   HCT 42.0 07/15/2020 0512   PLT 300 07/15/2020 0512   MCV 88.8 07/15/2020 0512   MCH 28.5 07/15/2020 0512   MCHC 32.1 07/15/2020 0512   RDW 12.4 07/15/2020 0512   LYMPHSABS 1.1 08/23/2010 0310   MONOABS 0.4 08/23/2010 0310   EOSABS 0.1 08/23/2010 0310   BASOSABS 0.0 08/23/2010 0310    BMET    Component Value Date/Time   NA 139 07/15/2020 0512   K 4.5 07/15/2020 0512   CL 105 07/15/2020 0512   CO2 24 07/15/2020 0512   GLUCOSE 107 (H) 07/15/2020 0512   BUN 17 07/15/2020 0512   CREATININE 0.74 07/15/2020 0512   CALCIUM 9.3 07/15/2020 0512   GFRNONAA >60 07/15/2020 0512   GFRAA  08/23/2010 0310    >60        The eGFR has been calculated using the MDRD equation. This calculation has not been validated in all clinical situations. eGFR's persistently <60 mL/min signify possible Chronic Kidney Disease.     BNP No results found for: "BNP"   Imaging:  No results found.       Latest Ref Rng & Units 09/15/2022    9:42 AM  PFT Results  FVC-Pre L 3.07  P  FVC-Predicted Pre % 90  P  FVC-Post L 3.16  P  FVC-Predicted Post % 93  P  Pre FEV1/FVC % % 78  P  Post FEV1/FCV % % 80  P  FEV1-Pre L 2.40  P  FEV1-Predicted Pre % 89  P  FEV1-Post L 2.52  P  DLCO uncorrected ml/min/mmHg 17.77  P  DLCO UNC% % 87  P  DLCO corrected ml/min/mmHg 17.77  P  DLCO COR %Predicted % 87  P  DLVA Predicted % 85  P  TLC L 5.24  P  TLC % Predicted % 105  P  RV % Predicted % 105  P    P Preliminary result    No results found for: "NITRICOXIDE"      Assessment & Plan:   Moderate persistent asthma Asthma with allergic phenotype. Compensated on current regimen. Clinically improved with ICS/LABA therapy. PFTs today were normal with midflow reversibility. She will continue antihistamine for trigger prevention. Asthma action plan in place.  Patient Instructions  Continue Albuterol inhaler 2 puffs  every 6 hours as needed for shortness of breath or wheezing Continue Breo 1 puff daily. Brush tongue and rinse mouth afterwards Continue allegra or zyrtec 1 tab daily for allergies Continue flonase nasal spray 2 sprays each nostril daily   Asthma action plan: START inhaler up to six times a day for worsening shortness of breath, wheezing and cough. If you symptoms do not improve in 24-48 hours, contact us for steroid course. If your symptoms rapidly worsen, you have trouble talking or extreme difficulty breathing, or you're not getting relief from your nebulizer/rescue, go to the emergency department.  Follow up in 4 months with Dr. Tonia Brooms or Philis Nettle. If symptoms do not improve or worsen, please contact office for sooner follow up or seek emergency  care.    I spent 28 minutes of dedicated to the care of this patient on the date of this encounter to include pre-visit review of records, face-to-face  time with the patient discussing conditions above, post visit ordering of testing, clinical documentation with the electronic health record, making appropriate referrals as documented, and communicating necessary findings to members of the patients care team.  Noemi Chapel, NP 09/15/2022  Pt aware and understands NP's role.

## 2022-10-13 ENCOUNTER — Ambulatory Visit: Payer: Medicare Other

## 2022-10-24 ENCOUNTER — Ambulatory Visit
Admission: RE | Admit: 2022-10-24 | Discharge: 2022-10-24 | Disposition: A | Discharge status: Home / Self Care | Payer: Medicare Other | Source: Ambulatory Visit | Attending: Obstetrics and Gynecology | Admitting: Obstetrics and Gynecology

## 2022-10-24 DIAGNOSIS — Z1231 Encounter for screening mammogram for malignant neoplasm of breast: Secondary | ICD-10-CM

## 2022-10-28 ENCOUNTER — Ambulatory Visit: Payer: Medicare Other | Admitting: Plastic Surgery

## 2022-12-18 NOTE — Progress Notes (Unsigned)
Synopsis: Referred in March 2024 for asthma/sob by Sigmund Hazel, MD  Subjective:   PATIENT ID: Mariah Vaughn GENDER: female DOB: [birth year], MRN: [id number]  No chief complaint on file.   PMH of asthma, started smoking at age 57, and smoked for 35 years. And she had frequent episodes of bronchitis.  From respiratory standpoint she is doing okay.  She does have shortness of breath with exertion.  She has allergies to dogs and cats at both which are in her home.  She uses her albuterol occasionally that does improve her symptoms but sometimes this makes her chest feel tight and that she cannot catch her breath.  She has not been on a maintenance inhaler in a long time.  At 1 point in time she used Advair.  And she recently was given prescription for Symbicort but it was $80 and she did not pick this up from the pharmacy.  OV 12/19/2022: Here today for follow-up.  Last seen in the office by Rhunette Croft, NP.  Had follow-up completed after pulmonary function test.  Doing well on Breo 200.  Continued antihistamines.  Recommended follow-up in 4 months with Dr. Tonia Brooms or myself started asthma action plan.      Past Medical History:  Diagnosis Date   Allergic rhinitis    Anxiety    Asthma    Bipolar 2 disorder (HCC)    Bulging lumbar disc    C SPINE   Depression    Gastritis    GANEM   History of palpitations    HX OF PAC'S AND HX OF PVC'S   Hx of smoking    Knee pain, right      Family History  Problem Relation Age of Onset   Breast cancer Mother 56   Atrial fibrillation Mother    Ovarian cancer Sister 27       maternal half sister   Lung cancer Maternal Grandmother        worked at [employer]   Prostate cancer Paternal Grandfather 67   Stroke Father    Pancreatitis Father    Pneumonia Father    Other Father        RESP FAILURE   ADD / ADHD Son    Autism Son        SENSORY PROCESSING DISORDER     Past Surgical History:  Procedure Laterality Date   COLONSCOPY  08/2010    ESOPHAGOGASTRODUODENOSCOPY  08/2010   ESOPHAGOGASTRODUODENOSCOPY  12/2014   SEPTOPLASTY  08/2010   TURBINATE REDUCTION  08/2010   WISDOM TOOTH EXTRACTION     AGE 52    Social History   Socioeconomic History   Marital status: Legally Separated    Spouse name: Not on file   Number of children: Not on file   Years of education: Not on file   Highest education level: Not on file  Occupational History   Not on file  Tobacco Use   Smoking status: Former    Current packs/day: 0.00    Average packs/day: 1.5 packs/day for 26.0 years (39.0 ttl pk-yrs)    Types: Cigarettes    Start date: 05/24/1975    Quit date: 05/23/2001    Years since quitting: 21.5   Smokeless tobacco: Never  Vaping Use   Vaping status: Never Used  Substance and Sexual Activity   Alcohol use: Yes    Comment: rarely   Drug use: No   Sexual activity: Yes  Other Topics Concern   Not on  file  Social History Narrative   Not on file   Social Determinants of Health   Financial Resource Strain: Not on file  Food Insecurity: Not on file  Transportation Needs: Not on file  Physical Activity: Not on file  Stress: Not on file  Social Connections: Not on file  Intimate Partner Violence: Not on file     Allergies  Allergen Reactions   Amoxicillin Shortness Of Breath, Itching and Rash   Codeine Nausea And Vomiting   Influenza Vaccines Shortness Of Breath   Oxycodone-Acetaminophen Nausea And Vomiting   Penicillins Shortness Of Breath, Itching and Rash   Sulfa Antibiotics Anaphylaxis and Other (See Comments)   Cephalexin Itching   Doxycycline     Stomach upset    Doxycycline Monohydrate Other (See Comments)   Elemental Sulfur    Flonase [Fluticasone Propionate]     headaches   Other Other (See Comments)   Peg-Kcl-Nacl-Nasulf-Na Asc-C Other (See Comments)    dehydration   Tamiflu [Oseltamivir Phosphate]     STOMACH UPSET   Cefaclor Itching and Rash    Shortness of breath     Outpatient Medications Prior  to Visit  Medication Sig Dispense Refill   acetaminophen (TYLENOL) 500 MG tablet Take 1,000 mg by mouth every 6 (six) hours as needed for mild pain, fever or headache.     acyclovir (ZOVIRAX) 400 MG tablet TAKE 1 TABLET BY MOUTH THREE TIMES DAILY FOR 10 DAYS AS NEEDED FOR OUTBREAK     albuterol (PROVENTIL HFA;VENTOLIN HFA) 108 (90 Base) MCG/ACT inhaler Inhale 2 puffs into the lungs every 4 (four) hours as needed for wheezing or shortness of breath.     ARIPiprazole (ABILIFY) 5 MG tablet Take 2.5 mg by mouth daily.     cetirizine (ZYRTEC ALLERGY) 10 MG tablet Take 10 mg by mouth daily.     Cholecalciferol (VITAMIN D3 PO) Take 1 capsule by mouth 2 (two) times a week.     citalopram (CELEXA) 10 MG tablet Take 2.5 mg by mouth daily. Take as directed.     fexofenadine (ALLEGRA) 180 MG tablet Take 180 mg by mouth daily as needed for allergies or rhinitis.     fluticasone (FLONASE SENSIMIST) 27.5 MCG/SPRAY nasal spray 1 spray in each nostril Nasally Once a day for 30 days     lithium carbonate (LITHOBID) 300 MG CR tablet Take 300 mg by mouth 2 (two) times daily.     LORazepam (ATIVAN) 0.5 MG tablet TAKE 1/2-1 TABLET BY MOUTH EVERY 4-6 HOURS AS NEEDED FOR SEVERE ANXIETY     meclizine (ANTIVERT) 25 MG tablet SMARTSIG:1 Tablet(s) By Mouth Every 12 Hours PRN     methocarbamol (ROBAXIN) 750 MG tablet Take 750 mg by mouth every 6 (six) hours as needed.     omeprazole (PRILOSEC) 20 MG capsule Take 1 capsule (20 mg total) by mouth daily for 10 days. 10 capsule 0   ondansetron (ZOFRAN) 4 MG tablet      No facility-administered medications prior to visit.    ROS   Objective:  Physical Exam   There were no vitals filed for this visit.    on RA BMI Readings from Last 3 Encounters:  09/15/22 31.78 kg/m  08/04/22 32.13 kg/m  06/29/22 31.98 kg/m   Wt Readings from Last 3 Encounters:  09/15/22 179 lb 6.4 oz (81.4 kg)  08/04/22 181 lb 6.4 oz (82.3 kg)  06/29/22 183 lb 6.4 oz (83.2 kg)     CBC  Component Value Date/Time   WBC 6.0 07/15/2020 0512   RBC 4.73 07/15/2020 0512   HGB 13.5 07/15/2020 0512   HCT 42.0 07/15/2020 0512   PLT 300 07/15/2020 0512   MCV 88.8 07/15/2020 0512   MCH 28.5 07/15/2020 0512   MCHC 32.1 07/15/2020 0512   RDW 12.4 07/15/2020 0512   LYMPHSABS 1.1 08/23/2010 0310   MONOABS 0.4 08/23/2010 0310   EOSABS 0.1 08/23/2010 0310   BASOSABS 0.0 08/23/2010 0310     Chest Imaging: No recent axial imaging of the chest.   Pulmonary Functions Testing Results:    Latest Ref Rng & Units 09/15/2022    9:42 AM  PFT Results  FVC-Pre L 3.07   FVC-Predicted Pre % 90   FVC-Post L 3.16   FVC-Predicted Post % 93   Pre FEV1/FVC % % 78   Post FEV1/FCV % % 80   FEV1-Pre L 2.40   FEV1-Predicted Pre % 89   FEV1-Post L 2.52   DLCO uncorrected ml/min/mmHg 17.77   DLCO UNC% % 87   DLCO corrected ml/min/mmHg 17.77   DLCO COR %Predicted % 87   DLVA Predicted % 85   TLC L 5.24   TLC % Predicted % 105   RV % Predicted % 105     FeNO:   Pathology:   Echocardiogram:   Heart Catheterization:     Assessment & Plan:   No diagnosis found.   Discussion:  This is a 57 year old female with known allergies on antihistamines.  Likely has moderate persistent asthma symptoms.  Not on maintenance inhaler.  Plan: Start Breo 200 Continue albuterol as needed. Continue Flonase nasal spray, continue antihistamines with Allegra daily Pulmonary function test prior to next office visit.  Repeat appointment in approximately 6 weeks after PFTs complete.   Current Outpatient Medications:    acetaminophen (TYLENOL) 500 MG tablet, Take 1,000 mg by mouth every 6 (six) hours as needed for mild pain, fever or headache., Disp: , Rfl:    acyclovir (ZOVIRAX) 400 MG tablet, TAKE 1 TABLET BY MOUTH THREE TIMES DAILY FOR 10 DAYS AS NEEDED FOR OUTBREAK, Disp: , Rfl:    albuterol (PROVENTIL HFA;VENTOLIN HFA) 108 (90 Base) MCG/ACT inhaler, Inhale 2 puffs into the lungs every 4  (four) hours as needed for wheezing or shortness of breath., Disp: , Rfl:    ARIPiprazole (ABILIFY) 5 MG tablet, Take 2.5 mg by mouth daily., Disp: , Rfl:    cetirizine (ZYRTEC ALLERGY) 10 MG tablet, Take 10 mg by mouth daily., Disp: , Rfl:    Cholecalciferol (VITAMIN D3 PO), Take 1 capsule by mouth 2 (two) times a week., Disp: , Rfl:    citalopram (CELEXA) 10 MG tablet, Take 2.5 mg by mouth daily. Take as directed., Disp: , Rfl:    fexofenadine (ALLEGRA) 180 MG tablet, Take 180 mg by mouth daily as needed for allergies or rhinitis., Disp: , Rfl:    fluticasone (FLONASE SENSIMIST) 27.5 MCG/SPRAY nasal spray, 1 spray in each nostril Nasally Once a day for 30 days, Disp: , Rfl:    lithium carbonate (LITHOBID) 300 MG CR tablet, Take 300 mg by mouth 2 (two) times daily., Disp: , Rfl:    LORazepam (ATIVAN) 0.5 MG tablet, TAKE 1/2-1 TABLET BY MOUTH EVERY 4-6 HOURS AS NEEDED FOR SEVERE ANXIETY, Disp: , Rfl:    meclizine (ANTIVERT) 25 MG tablet, SMARTSIG:1 Tablet(s) By Mouth Every 12 Hours PRN, Disp: , Rfl:    methocarbamol (ROBAXIN) 750 MG tablet, Take 750 mg by  mouth every 6 (six) hours as needed., Disp: , Rfl:    omeprazole (PRILOSEC) 20 MG capsule, Take 1 capsule (20 mg total) by mouth daily for 10 days., Disp: 10 capsule, Rfl: 0   ondansetron (ZOFRAN) 4 MG tablet, , Disp: , Rfl:     Josephine Igo, DO [facility] 12/18/2022 2:36 PM

## 2022-12-19 ENCOUNTER — Ambulatory Visit (INDEPENDENT_AMBULATORY_CARE_PROVIDER_SITE_OTHER): Payer: Medicare Other | Admitting: Pulmonary Disease

## 2022-12-19 ENCOUNTER — Encounter: Payer: Self-pay | Admitting: Pulmonary Disease

## 2022-12-19 VITALS — BP 110/80 | HR 85 | Ht 63.0 in | Wt 183.8 lb

## 2022-12-19 DIAGNOSIS — J4540 Moderate persistent asthma, uncomplicated: Secondary | ICD-10-CM | POA: Diagnosis not present

## 2022-12-19 DIAGNOSIS — G4733 Obstructive sleep apnea (adult) (pediatric): Secondary | ICD-10-CM

## 2022-12-19 NOTE — Patient Instructions (Signed)
Thank you for visiting Dr. Tonia Brooms at [facility]. Today we recommend the following:  Continue Breo and antihistamines.   Return as needed or in 1 year if needed.     Please do your part to reduce the spread of COVID-19.

## 2022-12-23 ENCOUNTER — Other Ambulatory Visit: Payer: Self-pay | Admitting: Family Medicine

## 2022-12-23 DIAGNOSIS — Z13820 Encounter for screening for osteoporosis: Secondary | ICD-10-CM

## 2023-01-15 ENCOUNTER — Other Ambulatory Visit: Payer: Self-pay | Admitting: Pulmonary Disease

## 2023-01-15 DIAGNOSIS — J4540 Moderate persistent asthma, uncomplicated: Secondary | ICD-10-CM

## 2023-02-27 ENCOUNTER — Other Ambulatory Visit: Payer: Self-pay | Admitting: Obstetrics and Gynecology

## 2023-02-27 DIAGNOSIS — Z1231 Encounter for screening mammogram for malignant neoplasm of breast: Secondary | ICD-10-CM

## 2023-03-30 ENCOUNTER — Ambulatory Visit: Payer: Medicare Other

## 2023-04-10 ENCOUNTER — Institutional Professional Consult (permissible substitution): Payer: Medicare Other | Admitting: Nurse Practitioner

## 2023-05-02 ENCOUNTER — Encounter: Payer: Self-pay | Admitting: Internal Medicine

## 2023-05-02 ENCOUNTER — Ambulatory Visit: Payer: Medicare Other | Admitting: Internal Medicine

## 2023-05-02 VITALS — BP 110/70 | HR 76 | Temp 98.3°F | Ht 63.0 in | Wt 186.0 lb

## 2023-05-02 DIAGNOSIS — J45909 Unspecified asthma, uncomplicated: Secondary | ICD-10-CM

## 2023-05-02 DIAGNOSIS — R0683 Snoring: Secondary | ICD-10-CM | POA: Diagnosis not present

## 2023-05-02 NOTE — Progress Notes (Unsigned)
05/02/23- 57 yoF for sleep evaluation- Snoring and patient stops breathing during night and waking with gasping for air.  Sx x 2 years. Epworth score-8 Body weight today-186 lbs Discussed the use of AI scribe software for clinical note transcription with the patient, who gave verbal consent to proceed.  History of Present Illness   The patient, with a past medical history of asthma, allergic rhinitis, gastritis, bulging lumbar disc, dyspnea on exertion, depression, bipolar two disorder, and anxiety, presents with concerns of possible obstructive sleep apnea. The patient reports snoring and episodes of stopped breathing during sleep, which have been ongoing for a couple of years. The patient also reports significant weight gain of about fifty-five to sixty pounds over the past three years, which she attributes to the side effects of antipsychotic medications. The patient has a history of septoplasty and turbinate reduction performed twenty-five years ago. The patient also reports occasional sleepwalking, a pattern that has persisted since childhood. The patient's asthma is reportedly well-controlled with the use of a Breo inhaler. The patient also reports occasional tachycardia, with a resting heart rate typically ranging from ninety to a hundred beats per minute. The patient denies any history of heart failure. The patient reports feeling tired during the day and often wakes up feeling tired. The patient also reports occasional napping during the day, which she finds helpful.   Prior to Admission medications   Medication Sig Start Date End Date Taking? Authorizing Provider  acetaminophen (TYLENOL) 500 MG tablet Take 1,000 mg by mouth every 6 (six) hours as needed for mild pain, fever or headache.   Yes [provider]  acyclovir (ZOVIRAX) 400 MG tablet TAKE 1 TABLET BY MOUTH THREE TIMES DAILY FOR 10 DAYS AS NEEDED FOR OUTBREAK   Yes [provider]  albuterol (PROVENTIL HFA;VENTOLIN  HFA) 108 (90 Base) MCG/ACT inhaler Inhale 2 puffs into the lungs every 4 (four) hours as needed for wheezing or shortness of breath.   Yes [provider]  ARIPiprazole (ABILIFY) 5 MG tablet Take 2.5 mg by mouth daily. 07/25/19  Yes [provider]  Earlie Server 200-25 MCG/ACT AEPB TAKE 1 PUFF BY MOUTH EVERY DAY 01/16/23  Yes Icard, Bradley L, DO  Cholecalciferol (VITAMIN D3 PO) Take 1 capsule by mouth 2 (two) times a week.   Yes [provider]  citalopram (CELEXA) 10 MG tablet Take 2.5 mg by mouth daily. Take as directed.   Yes [provider]  estradiol (ESTRACE) 0.5 MG tablet Take 0.5 mg by mouth daily.   Yes [provider]  fexofenadine (ALLEGRA ALLERGY) 180 MG tablet Take 180 mg by mouth daily.   Yes [provider]  fexofenadine (ALLEGRA) 180 MG tablet Take 180 mg by mouth daily as needed for allergies or rhinitis.   Yes [provider]  fluticasone (FLONASE SENSIMIST) 27.5 MCG/SPRAY nasal spray 1 spray in each nostril Nasally Once a day for 30 days 03/16/22  Yes [provider]  lithium carbonate (LITHOBID) 300 MG CR tablet Take 300 mg by mouth 2 (two) times daily. 03/24/20  Yes [provider]  LORazepam (ATIVAN) 0.5 MG tablet TAKE 1/2-1 TABLET BY MOUTH EVERY 4-6 HOURS AS NEEDED FOR SEVERE ANXIETY   Yes [provider]  meclizine (ANTIVERT) 25 MG tablet SMARTSIG:1 Tablet(s) By Mouth Every 12 Hours PRN 03/02/22  Yes [provider]  methocarbamol (ROBAXIN) 750 MG tablet Take 750 mg by mouth every 6 (six) hours as needed. 06/09/22  Yes [provider]  ondansetron (ZOFRAN) 4 MG tablet    Yes [provider]  progesterone (PROMETRIUM) 100 MG capsule Take 100 mg by mouth daily.   Yes [provider]  omeprazole (PRILOSEC) 20 MG capsule Take 1 capsule (20 mg total) by mouth daily for 10 days. 07/15/20 07/25/20  Claude Manges, PA-C   Past Medical History:  Diagnosis Date    Allergic rhinitis    Anxiety    Asthma    Bipolar 2 disorder (HCC)    Bulging lumbar disc    C SPINE   Depression    Gastritis    GANEM   History of palpitations    HX OF PAC'S AND HX OF PVC'S   Hx of smoking    Knee pain, right    Past Surgical History:  Procedure Laterality Date   COLONSCOPY  08/2010   ESOPHAGOGASTRODUODENOSCOPY  08/2010   ESOPHAGOGASTRODUODENOSCOPY  12/2014   SEPTOPLASTY  08/2010   TURBINATE REDUCTION  08/2010   WISDOM TOOTH EXTRACTION     AGE 50   Family History  Problem Relation Age of Onset   Breast cancer Mother 65   Atrial fibrillation Mother    Ovarian cancer Sister 62       maternal half sister   Lung cancer Maternal Grandmother        worked at [employer]   Prostate cancer Paternal Grandfather 75   Stroke Father    Pancreatitis Father    Pneumonia Father    Other Father        RESP FAILURE   ADD / ADHD Son    Autism Son        SENSORY PROCESSING DISORDER   Social History   Socioeconomic History   Marital status: Legally Separated    Spouse name: Not on file   Number of children: Not on file   Years of education: Not on file   Highest education level: Not on file  Occupational History   Not on file  Tobacco Use   Smoking status: Former    Current packs/day: 0.00    Average packs/day: 1.5 packs/day for 26.0 years (39.0 ttl pk-yrs)    Types: Cigarettes    Start date: 05/24/1975    Quit date: 05/23/2001    Years since quitting: 21.9   Smokeless tobacco: Never  Vaping Use   Vaping status: Never Used  Substance and Sexual Activity   Alcohol use: Yes    Comment: rarely   Drug use: No   Sexual activity: Yes  Other Topics Concern   Not on file  Social History Narrative   Not on file   Social Determinants of Health   Financial Resource Strain: Not on file  Food Insecurity: Not on file  Transportation Needs: Not on file  Physical Activity: Not on file  Stress: Not on file  Social Connections: Not on file  Intimate Partner  Violence: Not on file   ROS-see HPI   Negative unless "+" Constitutional:    weight loss, night sweats, fevers, chills, fatigue, lassitude. HEENT:    headaches, difficulty swallowing, tooth/dental problems, sore throat,       sneezing, itching, ear ache, nasal congestion, post nasal drip, snoring CV:    chest pain, orthopnea, PND, swelling in lower extremities, anasarca,                                  dizziness, palpitations Resp:   shortness  of breath with exertion or at rest.                productive cough,   non-productive cough, coughing up of blood.              change in color of mucus.  wheezing.   Skin:    rash or lesions. GI:  No-   heartburn, indigestion, abdominal pain, nausea, vomiting, diarrhea,                 change in bowel habits, loss of appetite GU: dysuria, change in color of urine, no urgency or frequency.   flank pain. MS:   joint pain, stiffness, decreased range of motion, back pain. Neuro-     nothing unusual Psych:  change in mood or affect.  depression or anxiety.   memory loss.  OBJ- Physical Exam General- Alert, Oriented, Affect-appropriate, Distress- none acute, overweight Skin- rash-none, lesions- none, excoriation- none Lymphadenopathy- none Head- atraumatic            Eyes- Gross vision intact, PERRLA, conjunctivae and secretions clear            Ears- Hearing, canals-normal            Nose- Clear, no-Septal dev, mucus, polyps, erosion, perforation             Throat- Mallampati II , mucosa clear , drainage- none, tonsils- atrophic Neck- flexible , trachea midline, no stridor , thyroid nl, carotid no bruit Chest - symmetrical excursion , unlabored           Heart/CV- RRR , no murmur , no gallop  , no rub, nl s1 s2                           - JVD- none , edema- none, stasis changes- none, varices- none           Lung- clear to P&A, wheeze- none, cough- none , dullness-none, rub- none           Chest wall-  Abd-  Br/ Gen/ Rectal- Not done, not  indicated Extrem- cyanosis- none, clubbing, none, atrophy- none, strength- nl Neuro- grossly intact to observation   Assessment and Plan    Suspected Obstructive Sleep Apnea Reports of snoring and cessation of breathing during sleep, daytime tiredness, and napping. Weight gain of approximately 55-60 pounds over the past few years. No current use of sleep medications. -Order home sleep study to confirm diagnosis.  Asthma Well-controlled with Breo inhaler. No current wheezing or other symptoms noted during examination. -Continue current management with Breo inhaler.  Allergic Rhinitis Reports of postnasal drainage and morning coughing. -Continue current management under Dr. Hilliard Clark and Rhunette Croft NP.  Tachycardia Reports of high resting heart rate, but no current medications for tachycardia. No history of heart failure. -Continue monitoring.  Bipolar II Disorder and Anxiety Reports of being under control. -Continue current psychotropic medications.  Weight Gain Significant weight gain over the past few years due to antipsychotic medications. Plans to start a diet for weight loss. -Encourage weight loss efforts.  Follow-up Await results of home sleep study. If no contact within two weeks post-study, patient to call office.

## 2023-05-02 NOTE — Telephone Encounter (Signed)
PT needed to see Dr. Before years end. Sched w/openeing we had w/Dr. Jeannie Fend

## 2023-05-02 NOTE — Patient Instructions (Signed)
Order- schedule home sleep test   dx Snoring  Please cal [place] about 2 weeks after your sleep tet for result and recommendations

## 2023-05-03 ENCOUNTER — Encounter: Payer: Self-pay | Admitting: Internal Medicine

## 2023-05-14 DIAGNOSIS — R0683 Snoring: Secondary | ICD-10-CM

## 2023-06-06 ENCOUNTER — Telehealth: Payer: Self-pay | Admitting: Physician Assistant

## 2023-06-06 NOTE — Telephone Encounter (Signed)
 Spoke with patient and she wanted to know who her new cardiologist is.   Former Office Manager patient. She called because she stated she was informed of her PFO in 2023 but it was not discussed after that,  She is not SOB. She is not in distress. Asymptomatic. ED precautions discussed.   Will have [facility] call her to schedule with a new cardiologist

## 2023-06-06 NOTE — Telephone Encounter (Signed)
 Pt c/o Shortness Of Breath: STAT if SOB developed within the last 24 hours or pt is noticeably SOB on the phone  1. Are you currently SOB (can you hear that pt is SOB on the phone)? Yes  2. How long have you been experiencing SOB? For a couple of years now  3. Are you SOB when sitting or when up moving around? Both   4. Are you currently experiencing any other symptoms? No

## 2023-06-07 ENCOUNTER — Institutional Professional Consult (permissible substitution): Payer: Medicare Other | Admitting: Nurse Practitioner

## 2023-06-28 ENCOUNTER — Ambulatory Visit: Payer: Medicare Other | Admitting: Physician Assistant

## 2023-06-29 ENCOUNTER — Ambulatory Visit: Payer: Medicare Other | Attending: Cardiovascular Disease | Admitting: Cardiovascular Disease

## 2023-06-29 ENCOUNTER — Telehealth: Payer: Self-pay | Admitting: Plastic Surgery

## 2023-06-29 ENCOUNTER — Encounter: Payer: Self-pay | Admitting: Cardiovascular Disease

## 2023-06-29 VITALS — BP 124/82 | HR 86 | Ht 63.5 in | Wt 180.6 lb

## 2023-06-29 DIAGNOSIS — Q2112 Patent foramen ovale: Secondary | ICD-10-CM

## 2023-06-29 NOTE — Progress Notes (Signed)
 Chief Complaint  Patient presents with   Follow-up    PVCs   History of Present Illness:57 yo female with history of bipolar disorder, anxiety, depression, palpitations, PACs, PvCs who is here today for cardiac follow up. She has been followed in our office by Dr. Dann. She was seen for dyspnea in the past. Her Apple watch showed an irregular rhythm in 2021. Echo in 2021 with normal LV function and no valve disease. Cardiac monitor in 2021 with sinus with rare PACs and rare PVCs. Coronary CTA in December 2023 with calcium score of zero and no evidence of CAD. PFO noted.   She is here today for follow up. The patient denies any chest pain, palpitations, lower extremity edema, orthopnea, PND, dizziness, near syncope or syncope. She still has dyspnea with exertion. She is trying to lose weight and exercising  Primary Care Physician: Mariah Planas, MD   Past Medical History:  Diagnosis Date   Allergic rhinitis    Anxiety    Asthma    Bipolar 2 disorder (HCC)    Bulging lumbar disc    C SPINE   Depression    Gastritis    GANEM   History of palpitations    HX OF PAC'S AND HX OF PVC'S   Hx of smoking    Knee pain, right     Past Surgical History:  Procedure Laterality Date   COLONSCOPY  08/2010   ESOPHAGOGASTRODUODENOSCOPY  08/2010   ESOPHAGOGASTRODUODENOSCOPY  12/2014   SEPTOPLASTY  08/2010   TURBINATE REDUCTION  08/2010   WISDOM TOOTH EXTRACTION     AGE 71    Current Outpatient Medications  Medication Sig Dispense Refill   acetaminophen  (TYLENOL ) 500 MG tablet Take 1,000 mg by mouth every 6 (six) hours as needed for mild pain, fever or headache.     acyclovir (ZOVIRAX) 400 MG tablet TAKE 1 TABLET BY MOUTH THREE TIMES DAILY FOR 10 DAYS AS NEEDED FOR OUTBREAK     albuterol  (PROVENTIL  HFA;VENTOLIN  HFA) 108 (90 Base) MCG/ACT inhaler Inhale 2 puffs into the lungs every 4 (four) hours as needed for wheezing or shortness of breath.     ARIPiprazole (ABILIFY) 5 MG tablet Take  2.5 mg by mouth daily.     BREO ELLIPTA  200-25 MCG/ACT AEPB TAKE 1 PUFF BY MOUTH EVERY DAY 60 each 2   Cholecalciferol (VITAMIN D3 PO) Take 1 capsule by mouth 2 (two) times a week.     citalopram (CELEXA) 10 MG tablet Take 2.5 mg by mouth daily. Take as directed.     estradiol (ESTRACE) 0.5 MG tablet Take 0.5 mg by mouth daily.     fexofenadine (ALLEGRA ALLERGY ) 180 MG tablet Take 180 mg by mouth daily.     fexofenadine (ALLEGRA) 180 MG tablet Take 180 mg by mouth daily as needed for allergies or rhinitis.     fluticasone  (FLONASE SENSIMIST ) 27.5 MCG/SPRAY nasal spray 1 spray in each nostril Nasally Once a day for 30 days     lithium carbonate (LITHOBID) 300 MG CR tablet Take 300 mg by mouth 2 (two) times daily.     LORazepam (ATIVAN) 0.5 MG tablet TAKE 1/2-1 TABLET BY MOUTH EVERY 4-6 HOURS AS NEEDED FOR SEVERE ANXIETY     meclizine (ANTIVERT) 25 MG tablet SMARTSIG:1 Tablet(s) By Mouth Every 12 Hours PRN     methocarbamol (ROBAXIN) 750 MG tablet Take 750 mg by mouth every 6 (six) hours as needed.     ondansetron  (ZOFRAN ) 4 MG tablet  progesterone (PROMETRIUM) 100 MG capsule Take 100 mg by mouth daily.     omeprazole  (PRILOSEC) 20 MG capsule Take 1 capsule (20 mg total) by mouth daily for 10 days. 10 capsule 0   No current facility-administered medications for this visit.    Allergies  Allergen Reactions   Amoxicillin Shortness Of Breath, Itching and Rash   Codeine Nausea And Vomiting   Influenza Vaccines Shortness Of Breath   Oxycodone-Acetaminophen  Nausea And Vomiting   Penicillins Shortness Of Breath, Itching and Rash   Sulfa Antibiotics Anaphylaxis and Other (See Comments)   Cephalexin Itching   Doxycycline      Stomach upset    Doxycycline  Monohydrate Other (See Comments)   Elemental Sulfur     Flonase [Fluticasone  Propionate]     headaches   Other Other (See Comments)   Peg-Kcl-Nacl-Nasulf-Na Asc-C Other (See Comments)    dehydration   Tamiflu [Oseltamivir Phosphate]      STOMACH UPSET   Cefaclor Itching and Rash    Shortness of breath    Social History   Socioeconomic History   Marital status: Legally Separated    Spouse name: Not on file   Number of children: Not on file   Years of education: Not on file   Highest education level: Not on file  Occupational History   Not on file  Tobacco Use   Smoking status: Former    Current packs/day: 0.00    Average packs/day: 1.5 packs/day for 26.0 years (39.0 ttl pk-yrs)    Types: Cigarettes    Start date: 05/24/1975    Quit date: 05/23/2001    Years since quitting: 22.1   Smokeless tobacco: Never  Vaping Use   Vaping status: Never Used  Substance and Sexual Activity   Alcohol use: Yes    Comment: rarely   Drug use: No   Sexual activity: Yes  Other Topics Concern   Not on file  Social History Narrative   Not on file   Social Drivers of Health   Financial Resource Strain: Not on file  Food Insecurity: Not on file  Transportation Needs: Not on file  Physical Activity: Not on file  Stress: Not on file  Social Connections: Not on file  Intimate Partner Violence: Not on file    Family History  Problem Relation Age of Onset   Breast cancer Mother 35   Atrial fibrillation Mother    Ovarian cancer Sister 2       maternal half sister   Lung cancer Maternal Grandmother        worked at [employer]   Prostate cancer Paternal Grandfather 47   Stroke Father    Pancreatitis Father    Pneumonia Father    Other Father        RESP FAILURE   ADD / ADHD Son    Autism Son        SENSORY PROCESSING DISORDER    Review of Systems:  As stated in the HPI and otherwise negative.   BP 124/82   Pulse 86   Ht 5' 3.5 (1.613 m)   Wt 81.9 kg   LMP  (LMP Unknown) Comment: negative HCG blood test 07-15-20  SpO2 98%   BMI 31.49 kg/m   Physical Examination: General: Well developed, well nourished, NAD  HEENT: OP clear, mucus membranes moist  SKIN: warm, dry. No rashes. Neuro: No focal deficits   Musculoskeletal: Muscle strength 5/5 all ext  Psychiatric: Mood and affect normal  Neck: No JVD, no carotid  bruits, no thyromegaly, no lymphadenopathy.  Lungs:Clear bilaterally, no wheezes, rhonci, crackles Cardiovascular: Regular rate and rhythm. No murmurs, gallops or rubs. Abdomen:Soft. Bowel sounds present. Non-tender.  Extremities: No lower extremity edema. Pulses are 2 + in the bilateral DP/PT.  EKG:  EKG is ordered today. The ekg ordered today demonstrates  EKG Interpretation Date/Time:  Thursday June 29 2023 16:26:29 EST Ventricular Rate:  86 PR Interval:  164 QRS Duration:  82 QT Interval:  374 QTC Calculation: 447 R Axis:   66  Text Interpretation: Normal sinus rhythm Confirmed by Verlin Bruckner [phone]) on 06/29/2023 4:27:58 PM   Recent Labs: No results found for requested labs within last 365 days.   Lipid Panel No results found for: CHOL, TRIG, HDL, CHOLHDL, VLDL, LDLCALC, LDLDIRECT   Wt Readings from Last 3 Encounters:  06/29/23 81.9 kg  05/02/23 84.4 kg  12/19/22 83.4 kg    Assessment and Plan:   1. PFO: We reviewed this today. I do not think her dyspnea is related to this. I have reassured her that this is generally a benign finding. No changes.   Labs/ tests ordered today include:  Orders Placed This Encounter  Procedures   EKG 12-Lead   Disposition:   F/U with me as needed  Signed, Bruckner Verlin, MD, [facility] 06/29/2023 4:49 PM    [facility] [address] [place], [place], [place]  [address] Phone: [phone]; Fax: [phone]

## 2023-06-29 NOTE — Patient Instructions (Signed)
 Medication Instructions:  No changes *If you need a refill on your cardiac medications before your next appointment, please call your pharmacy*   Lab Work: none If you have labs (blood work) drawn today and your tests are completely normal, you will receive your results only by: MyChart Message (if you have MyChart) OR A paper copy in the mail If you have any lab test that is abnormal or we need to change your treatment, we will call you to review the results.   Testing/Procedures: none   Follow-Up: At [facility], you and your health needs are our priority.  As part of our continuing mission to provide you with exceptional heart care, we have created designated Provider Care Teams.  These Care Teams include your primary Cardiologist (physician) and Advanced Practice Providers (APPs -  Physician Assistants and Nurse Practitioners) who all work together to provide you with the care you need, when you need it.   Your next appointment:   12 month(s)  Provider:   Verne Carrow, MD       1st Floor: - Lobby - Registration  - Pharmacy  - Lab - Cafe  2nd Floor: - PV Lab - Diagnostic Testing (echo, CT, nuclear med)  3rd Floor: - Vacant  4th Floor: - TCTS (cardiothoracic surgery) - AFib Clinic - Structural Heart Clinic - Vascular Surgery  - Vascular Ultrasound  5th Floor: - HeartCare Cardiology (general and EP) - Clinical Pharmacy for coumadin, hypertension, lipid, weight-loss medications, and med management appointments    Valet parking services will be available as well.

## 2023-06-29 NOTE — Telephone Encounter (Signed)
 Would you consider this a new consult. Pt was last seen for a consult in 07-26-2019. Then 06-14-22 she left her f/u visit, 06-29-22 last comp f/u insurance denied her sx. Patient now has new insurance and wants to come back in and try to move forward with a sx, also pt breast have gotten bigger she stated. She also had a visit with Cardio today 06-29-23.please advise

## 2023-06-30 NOTE — Telephone Encounter (Signed)
 Typically if not seen within 3 years, it is considered a "new" patient and would be a consult. Please confirm with Lisa/Dillingham

## 2023-07-11 ENCOUNTER — Institutional Professional Consult (permissible substitution): Payer: Medicare Other | Admitting: Plastic Surgery

## 2023-07-28 NOTE — Progress Notes (Signed)
 05/02/23- 57 yoF for sleep evaluation- Snoring and patient stops breathing during night and waking with gasping for air.  Sx x 2 years. Epworth score-8 Body weight today-186 lbs  07/31/23- 57 yoF followed for OSA, complicated by Asthma, Allergic Rhinitis, DDD, Bipolar, Anxiety/ Depression PFT 09/15/22- SPIROMETRY: normal spirometry Bronchodilator response: no significant response DLCO: within normal limits LUNG VOLUMES: within normal limits HST 05/14/23- AHI 8.2/hr, desat to 85%, body weight 186 lbs Body weight today-182 lbs For treatment decision -----SOB persistent.  Having consult with plastic surgeon regarding breast reduction surgery to help with breathing problems Sleeps alone. Complains of difficulty initiating and maintaining sleep with several wakings during night. Daytime tiredness. We discussed her mild OSA and treatment options focused on symptoms. After detailed discussion, she decided to try CPAP to see if it could relieve the daytime sleepiness. She had tended to spit out previous mouthpieces. 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  ROS-see HPI   + = positive Constitutional:    weight loss, night sweats, fevers, chills, fatigue, lassitude. HEENT:    headaches, difficulty swallowing, tooth/dental problems, sore throat,       sneezing, itching, ear ache, nasal congestion, post nasal drip, snoring CV:    chest pain, orthopnea, PND, swelling in lower extremities, anasarca,                                   dizziness, palpitations Resp:   shortness of breath with exertion or at rest.                productive cough,   non-productive cough, coughing up of blood.              change in color of mucus.  wheezing.   Skin:    rash or lesions. GI:  No-   heartburn, indigestion, abdominal pain, nausea, vomiting, diarrhea,                 change in bowel habits, loss of appetite GU: dysuria, change in color of urine, no urgency or frequency.   flank pain. MS:    joint pain, stiffness, decreased range of motion, back pain. Neuro-     nothing unusual Psych:  change in mood or affect.  depression or anxiety.   memory loss.  OBJ- Physical Exam General- Alert, Oriented, Affect-appropriate, Distress- none acute, overweight Skin- rash-none, lesions- none, excoriation- none Lymphadenopathy- none Head- atraumatic            Eyes- Gross vision intact, PERRLA, conjunctivae and secretions clear            Ears- Hearing, canals-normal            Nose- Clear, no-Septal dev, mucus, polyps, erosion, perforation             Throat- Mallampati II , mucosa clear , drainage- none, tonsils- atrophic Neck- flexible , trachea midline, no stridor , thyroid nl, carotid no bruit Chest - symmetrical excursion , unlabored           Heart/CV- RRR , no murmur , no gallop  , no rub, nl s1 s2                           - JVD- none , edema- none, stasis changes- none, varices- none           Lung-  clear to P&A, wheeze- none, cough- none , dullness-none, rub- none           Chest wall-  Abd-  Br/ Gen/ Rectal- Not done, not indicated Extrem- cyanosis- none, clubbing, none, atrophy- none, strength- nl Neuro- grossly intact to observation

## 2023-07-31 ENCOUNTER — Ambulatory Visit: Payer: Medicare Other | Admitting: Internal Medicine

## 2023-07-31 ENCOUNTER — Encounter: Payer: Self-pay | Admitting: Internal Medicine

## 2023-07-31 VITALS — BP 128/86 | HR 92 | Temp 97.5°F | Ht 63.5 in | Wt 182.0 lb

## 2023-07-31 DIAGNOSIS — R0609 Other forms of dyspnea: Secondary | ICD-10-CM | POA: Diagnosis not present

## 2023-07-31 DIAGNOSIS — G4733 Obstructive sleep apnea (adult) (pediatric): Secondary | ICD-10-CM

## 2023-07-31 DIAGNOSIS — Z87891 Personal history of nicotine dependence: Secondary | ICD-10-CM

## 2023-07-31 DIAGNOSIS — J4540 Moderate persistent asthma, uncomplicated: Secondary | ICD-10-CM

## 2023-07-31 NOTE — Assessment & Plan Note (Signed)
 Mild OSA. Unclear whether this or her Insomnia is important cause of the daytime tiredness she complains of. Plan: trial of CPAP. Watch need for sleep aid. Watch effect of her other meds on somnolence.

## 2023-07-31 NOTE — Patient Instructions (Signed)
 Order- new DME, new CPAP auto 4-15, mask of choice, humidifier, supplies, airView/card

## 2023-07-31 NOTE — Assessment & Plan Note (Signed)
 Currently clear and doing well. Don't anticipate problem with surgery if she goes ahead with reduction mammaplasty.

## 2023-08-08 ENCOUNTER — Ambulatory Visit: Payer: Medicare Other | Admitting: Plastic Surgery

## 2023-08-08 VITALS — BP 119/81 | HR 86 | Ht 63.5 in | Wt 181.0 lb

## 2023-08-08 DIAGNOSIS — M546 Pain in thoracic spine: Secondary | ICD-10-CM | POA: Diagnosis not present

## 2023-08-08 DIAGNOSIS — M542 Cervicalgia: Secondary | ICD-10-CM | POA: Diagnosis not present

## 2023-08-08 DIAGNOSIS — N62 Hypertrophy of breast: Secondary | ICD-10-CM | POA: Diagnosis not present

## 2023-08-08 DIAGNOSIS — R21 Rash and other nonspecific skin eruption: Secondary | ICD-10-CM | POA: Diagnosis not present

## 2023-08-08 DIAGNOSIS — G8929 Other chronic pain: Secondary | ICD-10-CM

## 2023-08-08 NOTE — Progress Notes (Signed)
   Subjective:    Patient ID: Mariah Vaughn, female    DOB: January 08, 1966, 58 y.o.   MRN: [id number]  The patient is a 58 year old female here for reevaluation of her breast.  She was seen in 2021 and then twice in 2024.  She complains of neck and back pain due to her large breasts.  She has tried over-the-counter pain medications with little to no help.  He is 5 feet 3 inches tall and weighs 181 pounds.  The estimated amount of tissue to be removed from each breast is approximately 500 to 550 g from each breast.  She has been tested for BRCA and was negative.  Her nipple distance from her sternal notch is 30 cm on the right and 31 cm on the left.  She also complains of skin breakdown and rashes in her skin folds.  She did physical therapy but it did not seem to help at all.  No matter how her weight changes which fluctuates about 20 pounds it does not seem to alleviate any of the weight of her breast or the back pain.  Her last mammogram was June 2024 and was negative.     Review of Systems  Constitutional:  Positive for activity change. Negative for appetite change.  Eyes: Negative.   Respiratory: Negative.    Cardiovascular: Negative.   Gastrointestinal: Negative.   Endocrine: Negative.   Genitourinary: Negative.   Musculoskeletal:  Positive for back pain and neck pain.  Skin:  Positive for rash.       Objective:   Physical Exam Vitals and nursing note reviewed.  Constitutional:      Appearance: Normal appearance.  HENT:     Head: Atraumatic.  Cardiovascular:     Rate and Rhythm: Normal rate.     Pulses: Normal pulses.  Pulmonary:     Effort: Pulmonary effort is normal.  Abdominal:     Palpations: Abdomen is soft.  Musculoskeletal:        General: No swelling or deformity.  Skin:    General: Skin is warm.     Capillary Refill: Capillary refill takes less than 2 seconds.  Neurological:     Mental Status: She is alert and oriented to person, place, and time.  Psychiatric:         Mood and Affect: Mood normal.        Behavior: Behavior normal.        Thought Content: Thought content normal.        Judgment: Judgment normal.         Assessment & Plan:     ICD-10-CM   1. Symptomatic mammary hypertrophy  N62     2. Neck pain  M54.2     3. Chronic bilateral thoracic back pain  M54.6    G89.29       Patient is a candidate for bilateral breast reduction with liposuction.  Risks and complications were explained including changes to nipple areola sensation.  It does not increase or decrease her risk of breast cancer and I cannot guarantee her a bra size.  Preoperative clearance requested.   Pictures were obtained of the patient and placed in the chart with the patient's or guardian's permission.

## 2023-08-11 ENCOUNTER — Telehealth: Payer: Self-pay

## 2023-08-11 ENCOUNTER — Telehealth: Payer: Self-pay | Admitting: Internal Medicine

## 2023-08-11 NOTE — Telephone Encounter (Signed)
 Fax received from Dr. Ulice Bold with [facility] to perform a bilateral breast reduction on patient.  Patient needs surgery clearance. Surgery is TBD. Patient was seen on 07/31/23. Office protocol is a risk assessment can be sent to surgeon if patient has been seen in 60 days or less.   Clearance was discussed in the clinic note 07/31/23 and I have faxed this over to Dr Dillingham's office.

## 2023-08-11 NOTE — Telephone Encounter (Signed)
 Faxed the Request for Surgical Clearance form to Dr. Atha Starks, PCP, at [phone] (phone number: [phone]) with confirmed receipt.

## 2023-08-16 ENCOUNTER — Ambulatory Visit (INDEPENDENT_AMBULATORY_CARE_PROVIDER_SITE_OTHER): Admitting: Student

## 2023-08-16 ENCOUNTER — Encounter: Payer: Self-pay | Admitting: Student

## 2023-08-16 VITALS — BP 116/82 | HR 115 | Ht 63.5 in | Wt 180.4 lb

## 2023-08-16 DIAGNOSIS — N62 Hypertrophy of breast: Secondary | ICD-10-CM

## 2023-08-16 NOTE — H&P (View-Only) (Signed)
 Patient ID: Mariah Vaughn, female    DOB: June 11, 1965, 58 y.o.   MRN: [id number]  No chief complaint on file.   No diagnosis found.   History of Present Illness: Mariah Vaughn is a 58 y.o.  female  with a history of macromastia.  She presents for preoperative evaluation for upcoming procedure, Bilateral Breast Reduction with liposuction, scheduled for 08/30/2023 with Dr.  Ulice Bold  The patient {HAS HAS WUJ:[id number]} had problems with anesthesia. ***  Summary of Previous Visit: Patient was seen for initial consult by Dr. Ulice Bold on 07/26/2019.  At this visit, she had back and neck pain due to her large breasts.  She reported she was tested for BRCA and was negative.  The patient was found to be a good candidate for breast reduction and physical therapy was ordered for her.  Patient then came back for reevaluation on 06/29/2022.  At this visit, she was noted to have a 20 pound weight gain since her previous visit.  It was recommended that patient follow back up in a few months for reevaluation.  Patient then was seen on 08/08/2023 by Dr. Ulice Bold.  Patient continued to complain of neck and back pain due to her large breasts.  The estimated amount of breast tissue to removed from each breast was 500 to 550 g from each breast.  Her STN was 30 cm on the right and 31 cm on the left.  Patient reported that she tried physical therapy but it did not seem to help at all.  Patient was found to be a candidate for bilateral breast reduction with liposuction.  Estimated excess breast tissue to be removed at time of surgery: *** grams  Job: ***  PMH Significant for: ***   Past Medical History: Allergies: Allergies  Allergen Reactions  . Amoxicillin Shortness Of Breath, Itching and Rash  . Codeine Nausea And Vomiting  . Influenza Vaccines Shortness Of Breath  . Oxycodone-Acetaminophen Nausea And Vomiting  . Penicillins Shortness Of Breath, Itching and Rash  . Sulfa Antibiotics Anaphylaxis and  Other (See Comments)  . Cephalexin Itching  . Doxycycline     Stomach upset   . Doxycycline Monohydrate Other (See Comments)  . Elemental Sulfur   . Flonase [Fluticasone Propionate]     headaches  . Other Other (See Comments)  . Peg-Kcl-Nacl-Nasulf-Na Asc-C Other (See Comments)    dehydration  . Tamiflu [Oseltamivir Phosphate]     STOMACH UPSET  . Cefaclor Itching and Rash    Shortness of breath    Current Medications:  Current Outpatient Medications:  .  acetaminophen (TYLENOL) 500 MG tablet, Take 1,000 mg by mouth every 6 (six) hours as needed for mild pain, fever or headache., Disp: , Rfl:  .  acyclovir (ZOVIRAX) 400 MG tablet, TAKE 1 TABLET BY MOUTH THREE TIMES DAILY FOR 10 DAYS AS NEEDED FOR OUTBREAK, Disp: , Rfl:  .  albuterol (PROVENTIL HFA;VENTOLIN HFA) 108 (90 Base) MCG/ACT inhaler, Inhale 2 puffs into the lungs every 4 (four) hours as needed for wheezing or shortness of breath., Disp: , Rfl:  .  ARIPiprazole (ABILIFY) 5 MG tablet, Take 2.5 mg by mouth daily., Disp: , Rfl:  .  BREO ELLIPTA 200-25 MCG/ACT AEPB, TAKE 1 PUFF BY MOUTH EVERY DAY, Disp: 60 each, Rfl: 2 .  Cholecalciferol (VITAMIN D3 PO), Take 1 capsule by mouth 2 (two) times a week., Disp: , Rfl:  .  citalopram (CELEXA) 10 MG tablet, Take 2.5 mg by mouth daily.  Take as directed., Disp: , Rfl:  .  estradiol (ESTRACE) 0.5 MG tablet, Take 0.5 mg by mouth daily., Disp: , Rfl:  .  fexofenadine (ALLEGRA ALLERGY) 180 MG tablet, Take 180 mg by mouth daily., Disp: , Rfl:  .  fexofenadine (ALLEGRA) 180 MG tablet, Take 180 mg by mouth daily as needed for allergies or rhinitis., Disp: , Rfl:  .  fluticasone (FLONASE SENSIMIST) 27.5 MCG/SPRAY nasal spray, 1 spray in each nostril Nasally Once a day for 30 days, Disp: , Rfl:  .  lithium carbonate (LITHOBID) 300 MG CR tablet, Take 300 mg by mouth 2 (two) times daily., Disp: , Rfl:  .  LORazepam (ATIVAN) 0.5 MG tablet, TAKE 1/2-1 TABLET BY MOUTH EVERY 4-6 HOURS AS NEEDED FOR  SEVERE ANXIETY, Disp: , Rfl:  .  meclizine (ANTIVERT) 25 MG tablet, SMARTSIG:1 Tablet(s) By Mouth Every 12 Hours PRN, Disp: , Rfl:  .  methocarbamol (ROBAXIN) 750 MG tablet, Take 750 mg by mouth every 6 (six) hours as needed., Disp: , Rfl:  .  omeprazole (PRILOSEC) 20 MG capsule, Take 1 capsule (20 mg total) by mouth daily for 10 days., Disp: 10 capsule, Rfl: 0 .  ondansetron (ZOFRAN) 4 MG tablet, , Disp: , Rfl:  .  progesterone (PROMETRIUM) 100 MG capsule, Take 100 mg by mouth daily., Disp: , Rfl:   Past Medical Problems: Past Medical History:  Diagnosis Date  . Allergic rhinitis   . Anxiety   . Asthma   . Bipolar 2 disorder (HCC)   . Bulging lumbar disc    C SPINE  . Depression   . Gastritis    GANEM  . History of palpitations    HX OF PAC'S AND HX OF PVC'S  . Hx of smoking   . Knee pain, right     Past Surgical History: Past Surgical History:  Procedure Laterality Date  . COLONSCOPY  08/2010  . ESOPHAGOGASTRODUODENOSCOPY  08/2010  . ESOPHAGOGASTRODUODENOSCOPY  12/2014  . SEPTOPLASTY  08/2010  . TURBINATE REDUCTION  08/2010  . WISDOM TOOTH EXTRACTION     AGE 44    Social History: Social History   Socioeconomic History  . Marital status: Legally Separated    Spouse name: Not on file  . Number of children: Not on file  . Years of education: Not on file  . Highest education level: Not on file  Occupational History  . Not on file  Tobacco Use  . Smoking status: Former    Current packs/day: 0.00    Average packs/day: 1.5 packs/day for 26.0 years (39.0 ttl pk-yrs)    Types: Cigarettes    Start date: 05/24/1975    Quit date: 05/23/2001    Years since quitting: 22.2  . Smokeless tobacco: Never  Vaping Use  . Vaping status: Never Used  Substance and Sexual Activity  . Alcohol use: Yes    Comment: rarely  . Drug use: No  . Sexual activity: Yes  Other Topics Concern  . Not on file  Social History Narrative  . Not on file   Social Drivers of Health   Financial  Resource Strain: Not on file  Food Insecurity: Not on file  Transportation Needs: Not on file  Physical Activity: Not on file  Stress: Not on file  Social Connections: Not on file  Intimate Partner Violence: Not on file    Family History: Family History  Problem Relation Age of Onset  . Breast cancer Mother 26  . Atrial fibrillation Mother   .  Ovarian cancer Sister 72       maternal half sister  . Lung cancer Maternal Grandmother        worked at [employer]  . Prostate cancer Paternal Grandfather 52  . Stroke Father   . Pancreatitis Father   . Pneumonia Father   . Other Father        RESP FAILURE  . ADD / ADHD Son   . Autism Son        SENSORY PROCESSING DISORDER    Review of Systems: ROS  Physical Exam: Vital Signs LMP  (LMP Unknown) Comment: negative HCG blood test 07-15-20  Physical Exam *** Constitutional:      General: Not in acute distress.    Appearance: Normal appearance. Not ill-appearing.  HENT:     Head: Normocephalic and atraumatic.  Eyes:     Pupils: Pupils are equal, round Neck:     Musculoskeletal: Normal range of motion.  Cardiovascular:     Rate and Rhythm: Normal rate    Pulses: Normal pulses.  Pulmonary:     Effort: Pulmonary effort is normal. No respiratory distress.  Musculoskeletal: Normal range of motion.  Skin:    General: Skin is warm and dry.     Findings: No erythema or rash.  Neurological:     General: No focal deficit present.     Mental Status: Alert and oriented to person, place, and time. Mental status is at baseline.     Motor: No weakness.  Psychiatric:        Mood and Affect: Mood normal.        Behavior: Behavior normal.    Assessment/Plan: The patient is scheduled for bilateral breast reduction with Dr. {NWGNF:[id number]::"QMVHQI","ONGEXBMWUX"}.  Risks, benefits, and alternatives of procedure discussed, questions answered and consent obtained.    Smoking Status: ***; Counseling Given? *** Last Mammogram: ***; Results:  ***  Caprini Score: ***; Risk Factors include: ***, BMI *** 25, and length of planned surgery. Recommendation for mechanical *** pharmacological prophylaxis. Encourage early ambulation.   Pictures obtained: @consult ***  Post-op Rx sent to pharmacy: {Blank:19197::"Oxycodone, Zofran, Keflex","Oxycodone, Zofran"}  Patient was provided with the breast reduction and General Surgical Risk consent document and Pain Medication Agreement prior to their appointment.  They had adequate time to read through the risk consent documents and Pain Medication Agreement. We also discussed them in person together during this preop appointment. All of their questions were answered to their satisfaction.  Recommended calling if they have any further questions.  Risk consent form and Pain Medication Agreement to be scanned into patient's chart.  The risk that can be encountered with breast reduction were discussed and include the following but not limited to these:  Breast asymmetry, fluid accumulation, firmness of the breast, inability to breast feed, loss of nipple or areola, skin loss, decrease or no nipple sensation, fat necrosis of the breast tissue, bleeding, infection, healing delay.  There are risks of anesthesia, changes to skin sensation and injury to nerves or blood vessels.  The muscle can be temporarily or permanently injured.  You may have an allergic reaction to tape, suture, glue, blood products which can result in skin discoloration, swelling, pain, skin lesions, poor healing.  Any of these can lead to the need for revisonal surgery or stage procedures.  A reduction has potential to interfere with diagnostic procedures.  Nipple or breast piercing can increase risks of infection.  This procedure is best done when the breast is fully developed.  Changes  in the breast will continue to occur over time.  Pregnancy can alter the outcomes of previous breast reduction surgery, weight gain and weigh loss can also effect  the long term appearance.     Electronically signed by: Laurena Spies, PA-C 08/16/2023 1:22 PM

## 2023-08-16 NOTE — Progress Notes (Signed)
 Patient ID: Mariah Vaughn, female    DOB: June 11, 1965, 58 y.o.   MRN: [id number]  No chief complaint on file.   No diagnosis found.   History of Present Illness: Mariah Vaughn is a 58 y.o.  female  with a history of macromastia.  She presents for preoperative evaluation for upcoming procedure, Bilateral Breast Reduction with liposuction, scheduled for 08/30/2023 with Dr.  Ulice Bold  The patient {HAS HAS WUJ:[id number]} had problems with anesthesia. ***  Summary of Previous Visit: Patient was seen for initial consult by Dr. Ulice Bold on 07/26/2019.  At this visit, she had back and neck pain due to her large breasts.  She reported she was tested for BRCA and was negative.  The patient was found to be a good candidate for breast reduction and physical therapy was ordered for her.  Patient then came back for reevaluation on 06/29/2022.  At this visit, she was noted to have a 20 pound weight gain since her previous visit.  It was recommended that patient follow back up in a few months for reevaluation.  Patient then was seen on 08/08/2023 by Dr. Ulice Bold.  Patient continued to complain of neck and back pain due to her large breasts.  The estimated amount of breast tissue to removed from each breast was 500 to 550 g from each breast.  Her STN was 30 cm on the right and 31 cm on the left.  Patient reported that she tried physical therapy but it did not seem to help at all.  Patient was found to be a candidate for bilateral breast reduction with liposuction.  Estimated excess breast tissue to be removed at time of surgery: *** grams  Job: ***  PMH Significant for: ***   Past Medical History: Allergies: Allergies  Allergen Reactions  . Amoxicillin Shortness Of Breath, Itching and Rash  . Codeine Nausea And Vomiting  . Influenza Vaccines Shortness Of Breath  . Oxycodone-Acetaminophen Nausea And Vomiting  . Penicillins Shortness Of Breath, Itching and Rash  . Sulfa Antibiotics Anaphylaxis and  Other (See Comments)  . Cephalexin Itching  . Doxycycline     Stomach upset   . Doxycycline Monohydrate Other (See Comments)  . Elemental Sulfur   . Flonase [Fluticasone Propionate]     headaches  . Other Other (See Comments)  . Peg-Kcl-Nacl-Nasulf-Na Asc-C Other (See Comments)    dehydration  . Tamiflu [Oseltamivir Phosphate]     STOMACH UPSET  . Cefaclor Itching and Rash    Shortness of breath    Current Medications:  Current Outpatient Medications:  .  acetaminophen (TYLENOL) 500 MG tablet, Take 1,000 mg by mouth every 6 (six) hours as needed for mild pain, fever or headache., Disp: , Rfl:  .  acyclovir (ZOVIRAX) 400 MG tablet, TAKE 1 TABLET BY MOUTH THREE TIMES DAILY FOR 10 DAYS AS NEEDED FOR OUTBREAK, Disp: , Rfl:  .  albuterol (PROVENTIL HFA;VENTOLIN HFA) 108 (90 Base) MCG/ACT inhaler, Inhale 2 puffs into the lungs every 4 (four) hours as needed for wheezing or shortness of breath., Disp: , Rfl:  .  ARIPiprazole (ABILIFY) 5 MG tablet, Take 2.5 mg by mouth daily., Disp: , Rfl:  .  BREO ELLIPTA 200-25 MCG/ACT AEPB, TAKE 1 PUFF BY MOUTH EVERY DAY, Disp: 60 each, Rfl: 2 .  Cholecalciferol (VITAMIN D3 PO), Take 1 capsule by mouth 2 (two) times a week., Disp: , Rfl:  .  citalopram (CELEXA) 10 MG tablet, Take 2.5 mg by mouth daily.  Take as directed., Disp: , Rfl:  .  estradiol (ESTRACE) 0.5 MG tablet, Take 0.5 mg by mouth daily., Disp: , Rfl:  .  fexofenadine (ALLEGRA ALLERGY) 180 MG tablet, Take 180 mg by mouth daily., Disp: , Rfl:  .  fexofenadine (ALLEGRA) 180 MG tablet, Take 180 mg by mouth daily as needed for allergies or rhinitis., Disp: , Rfl:  .  fluticasone (FLONASE SENSIMIST) 27.5 MCG/SPRAY nasal spray, 1 spray in each nostril Nasally Once a day for 30 days, Disp: , Rfl:  .  lithium carbonate (LITHOBID) 300 MG CR tablet, Take 300 mg by mouth 2 (two) times daily., Disp: , Rfl:  .  LORazepam (ATIVAN) 0.5 MG tablet, TAKE 1/2-1 TABLET BY MOUTH EVERY 4-6 HOURS AS NEEDED FOR  SEVERE ANXIETY, Disp: , Rfl:  .  meclizine (ANTIVERT) 25 MG tablet, SMARTSIG:1 Tablet(s) By Mouth Every 12 Hours PRN, Disp: , Rfl:  .  methocarbamol (ROBAXIN) 750 MG tablet, Take 750 mg by mouth every 6 (six) hours as needed., Disp: , Rfl:  .  omeprazole (PRILOSEC) 20 MG capsule, Take 1 capsule (20 mg total) by mouth daily for 10 days., Disp: 10 capsule, Rfl: 0 .  ondansetron (ZOFRAN) 4 MG tablet, , Disp: , Rfl:  .  progesterone (PROMETRIUM) 100 MG capsule, Take 100 mg by mouth daily., Disp: , Rfl:   Past Medical Problems: Past Medical History:  Diagnosis Date  . Allergic rhinitis   . Anxiety   . Asthma   . Bipolar 2 disorder (HCC)   . Bulging lumbar disc    C SPINE  . Depression   . Gastritis    GANEM  . History of palpitations    HX OF PAC'S AND HX OF PVC'S  . Hx of smoking   . Knee pain, right     Past Surgical History: Past Surgical History:  Procedure Laterality Date  . COLONSCOPY  08/2010  . ESOPHAGOGASTRODUODENOSCOPY  08/2010  . ESOPHAGOGASTRODUODENOSCOPY  12/2014  . SEPTOPLASTY  08/2010  . TURBINATE REDUCTION  08/2010  . WISDOM TOOTH EXTRACTION     AGE 44    Social History: Social History   Socioeconomic History  . Marital status: Legally Separated    Spouse name: Not on file  . Number of children: Not on file  . Years of education: Not on file  . Highest education level: Not on file  Occupational History  . Not on file  Tobacco Use  . Smoking status: Former    Current packs/day: 0.00    Average packs/day: 1.5 packs/day for 26.0 years (39.0 ttl pk-yrs)    Types: Cigarettes    Start date: 05/24/1975    Quit date: 05/23/2001    Years since quitting: 22.2  . Smokeless tobacco: Never  Vaping Use  . Vaping status: Never Used  Substance and Sexual Activity  . Alcohol use: Yes    Comment: rarely  . Drug use: No  . Sexual activity: Yes  Other Topics Concern  . Not on file  Social History Narrative  . Not on file   Social Drivers of Health   Financial  Resource Strain: Not on file  Food Insecurity: Not on file  Transportation Needs: Not on file  Physical Activity: Not on file  Stress: Not on file  Social Connections: Not on file  Intimate Partner Violence: Not on file    Family History: Family History  Problem Relation Age of Onset  . Breast cancer Mother 26  . Atrial fibrillation Mother   .  Ovarian cancer Sister 72       maternal half sister  . Lung cancer Maternal Grandmother        worked at [employer]  . Prostate cancer Paternal Grandfather 52  . Stroke Father   . Pancreatitis Father   . Pneumonia Father   . Other Father        RESP FAILURE  . ADD / ADHD Son   . Autism Son        SENSORY PROCESSING DISORDER    Review of Systems: ROS  Physical Exam: Vital Signs LMP  (LMP Unknown) Comment: negative HCG blood test 07-15-20  Physical Exam *** Constitutional:      General: Not in acute distress.    Appearance: Normal appearance. Not ill-appearing.  HENT:     Head: Normocephalic and atraumatic.  Eyes:     Pupils: Pupils are equal, round Neck:     Musculoskeletal: Normal range of motion.  Cardiovascular:     Rate and Rhythm: Normal rate    Pulses: Normal pulses.  Pulmonary:     Effort: Pulmonary effort is normal. No respiratory distress.  Musculoskeletal: Normal range of motion.  Skin:    General: Skin is warm and dry.     Findings: No erythema or rash.  Neurological:     General: No focal deficit present.     Mental Status: Alert and oriented to person, place, and time. Mental status is at baseline.     Motor: No weakness.  Psychiatric:        Mood and Affect: Mood normal.        Behavior: Behavior normal.    Assessment/Plan: The patient is scheduled for bilateral breast reduction with Dr. {NWGNF:62130::"QMVHQI","ONGEXBMWUX"}.  Risks, benefits, and alternatives of procedure discussed, questions answered and consent obtained.    Smoking Status: ***; Counseling Given? *** Last Mammogram: ***; Results:  ***  Caprini Score: ***; Risk Factors include: ***, BMI *** 25, and length of planned surgery. Recommendation for mechanical *** pharmacological prophylaxis. Encourage early ambulation.   Pictures obtained: @consult ***  Post-op Rx sent to pharmacy: {Blank:19197::"Oxycodone, Zofran, Keflex","Oxycodone, Zofran"}  Patient was provided with the breast reduction and General Surgical Risk consent document and Pain Medication Agreement prior to their appointment.  They had adequate time to read through the risk consent documents and Pain Medication Agreement. We also discussed them in person together during this preop appointment. All of their questions were answered to their satisfaction.  Recommended calling if they have any further questions.  Risk consent form and Pain Medication Agreement to be scanned into patient's chart.  The risk that can be encountered with breast reduction were discussed and include the following but not limited to these:  Breast asymmetry, fluid accumulation, firmness of the breast, inability to breast feed, loss of nipple or areola, skin loss, decrease or no nipple sensation, fat necrosis of the breast tissue, bleeding, infection, healing delay.  There are risks of anesthesia, changes to skin sensation and injury to nerves or blood vessels.  The muscle can be temporarily or permanently injured.  You may have an allergic reaction to tape, suture, glue, blood products which can result in skin discoloration, swelling, pain, skin lesions, poor healing.  Any of these can lead to the need for revisonal surgery or stage procedures.  A reduction has potential to interfere with diagnostic procedures.  Nipple or breast piercing can increase risks of infection.  This procedure is best done when the breast is fully developed.  Changes  in the breast will continue to occur over time.  Pregnancy can alter the outcomes of previous breast reduction surgery, weight gain and weigh loss can also effect  the long term appearance.     Electronically signed by: Laurena Spies, PA-C 08/16/2023 1:22 PM

## 2023-08-17 MED ORDER — DOXYCYCLINE HYCLATE 100 MG PO TABS: 100.0000 mg | ORAL_TABLET | Freq: Two times a day (BID) | ORAL | 0 refills | Status: AC

## 2023-08-17 MED ORDER — TRAMADOL HCL 50 MG PO TABS
50.0000 mg | ORAL_TABLET | Freq: Three times a day (TID) | ORAL | 0 refills | Status: AC | PRN
Start: 2023-08-17 — End: ?

## 2023-08-17 MED ORDER — ONDANSETRON HCL 4 MG PO TABS: 4.0000 mg | ORAL_TABLET | Freq: Three times a day (TID) | ORAL | 0 refills | Status: AC | PRN

## 2023-08-20 NOTE — Assessment & Plan Note (Signed)
 Current PFT again WNL. Dyspnea complaint not explained by pulmonary status.

## 2023-08-22 ENCOUNTER — Ambulatory Visit: Payer: Medicare Other | Admitting: Cardiology

## 2023-08-23 ENCOUNTER — Encounter (HOSPITAL_BASED_OUTPATIENT_CLINIC_OR_DEPARTMENT_OTHER): Payer: Self-pay | Admitting: Plastic Surgery

## 2023-08-23 ENCOUNTER — Other Ambulatory Visit: Payer: Self-pay

## 2023-08-30 ENCOUNTER — Ambulatory Visit (HOSPITAL_BASED_OUTPATIENT_CLINIC_OR_DEPARTMENT_OTHER)
Admission: RE | Admit: 2023-08-30 | Discharge: 2023-08-30 | Disposition: A | Discharge status: Home / Self Care | Attending: Plastic Surgery | Admitting: Plastic Surgery

## 2023-08-30 ENCOUNTER — Ambulatory Visit (HOSPITAL_BASED_OUTPATIENT_CLINIC_OR_DEPARTMENT_OTHER): Admitting: Anesthesiology

## 2023-08-30 ENCOUNTER — Other Ambulatory Visit: Payer: Self-pay

## 2023-08-30 ENCOUNTER — Encounter (HOSPITAL_BASED_OUTPATIENT_CLINIC_OR_DEPARTMENT_OTHER)
Admission: RE | Disposition: A | Discharge status: Home / Self Care | Payer: Self-pay | Source: Home / Self Care | Attending: Plastic Surgery

## 2023-08-30 ENCOUNTER — Encounter (HOSPITAL_BASED_OUTPATIENT_CLINIC_OR_DEPARTMENT_OTHER): Payer: Self-pay | Admitting: Plastic Surgery

## 2023-08-30 DIAGNOSIS — Z7951 Long term (current) use of inhaled steroids: Secondary | ICD-10-CM | POA: Insufficient documentation

## 2023-08-30 DIAGNOSIS — M549 Dorsalgia, unspecified: Secondary | ICD-10-CM | POA: Diagnosis not present

## 2023-08-30 DIAGNOSIS — Q2112 Patent foramen ovale: Secondary | ICD-10-CM | POA: Diagnosis not present

## 2023-08-30 DIAGNOSIS — N62 Hypertrophy of breast: Secondary | ICD-10-CM | POA: Diagnosis not present

## 2023-08-30 DIAGNOSIS — M542 Cervicalgia: Secondary | ICD-10-CM | POA: Diagnosis not present

## 2023-08-30 DIAGNOSIS — Z803 Family history of malignant neoplasm of breast: Secondary | ICD-10-CM | POA: Diagnosis not present

## 2023-08-30 DIAGNOSIS — Z87891 Personal history of nicotine dependence: Secondary | ICD-10-CM | POA: Diagnosis not present

## 2023-08-30 DIAGNOSIS — G4733 Obstructive sleep apnea (adult) (pediatric): Secondary | ICD-10-CM | POA: Insufficient documentation

## 2023-08-30 DIAGNOSIS — Z79890 Hormone replacement therapy (postmenopausal): Secondary | ICD-10-CM | POA: Insufficient documentation

## 2023-08-30 DIAGNOSIS — J45909 Unspecified asthma, uncomplicated: Secondary | ICD-10-CM

## 2023-08-30 DIAGNOSIS — F3181 Bipolar II disorder: Secondary | ICD-10-CM | POA: Insufficient documentation

## 2023-08-30 DIAGNOSIS — F418 Other specified anxiety disorders: Secondary | ICD-10-CM | POA: Diagnosis not present

## 2023-08-30 DIAGNOSIS — Z01818 Encounter for other preprocedural examination: Secondary | ICD-10-CM

## 2023-08-30 DIAGNOSIS — N951 Menopausal and female climacteric states: Secondary | ICD-10-CM | POA: Insufficient documentation

## 2023-08-30 HISTORY — PX: BREAST REDUCTION SURGERY: SHX8

## 2023-08-30 HISTORY — DX: Sleep apnea, unspecified: G47.30

## 2023-08-30 HISTORY — DX: Other specified postprocedural states: Z98.890

## 2023-08-30 HISTORY — DX: Patent foramen ovale: Q21.12

## 2023-08-30 HISTORY — DX: Nausea with vomiting, unspecified: R11.2

## 2023-08-30 SURGERY — BREAST REDUCTION WITH LIPOSUCTION
Anesthesia: General | Site: Breast | Laterality: Bilateral

## 2023-08-30 MED ORDER — FENTANYL CITRATE (PF) 100 MCG/2ML IJ SOLN: 25.0000 ug | INTRAMUSCULAR | Status: DC | PRN

## 2023-08-30 MED ORDER — FENTANYL CITRATE (PF) 100 MCG/2ML IJ SOLN
INTRAMUSCULAR | Status: AC
Start: 2023-08-30 — End: ?
  Filled 2023-08-30: qty 2

## 2023-08-30 MED ORDER — KETAMINE HCL 50 MG/5ML IJ SOSY
PREFILLED_SYRINGE | INTRAMUSCULAR | Status: AC
  Filled 2023-08-30: qty 5

## 2023-08-30 MED ORDER — PHENYLEPHRINE HCL (PRESSORS) 10 MG/ML IV SOLN
INTRAVENOUS | Status: DC | PRN
  Administered 2023-08-30: 80 ug via INTRAVENOUS

## 2023-08-30 MED ORDER — LIDOCAINE 2% (20 MG/ML) 5 ML SYRINGE
INTRAMUSCULAR | Status: AC
  Filled 2023-08-30: qty 5

## 2023-08-30 MED ORDER — ROCURONIUM BROMIDE 100 MG/10ML IV SOLN
INTRAVENOUS | Status: DC | PRN
  Administered 2023-08-30: 40 mg via INTRAVENOUS

## 2023-08-30 MED ORDER — SODIUM CHLORIDE 0.9% FLUSH: 3.0000 mL | INTRAVENOUS | Status: DC | PRN

## 2023-08-30 MED ORDER — AMISULPRIDE (ANTIEMETIC) 5 MG/2ML IV SOLN
INTRAVENOUS | Status: AC
  Filled 2023-08-30: qty 4

## 2023-08-30 MED ORDER — SODIUM CHLORIDE 0.9% FLUSH
3.0000 mL | Freq: Two times a day (BID) | INTRAVENOUS | Status: DC
Start: 2023-08-30 — End: 2023-08-30

## 2023-08-30 MED ORDER — MIDAZOLAM HCL 2 MG/2ML IJ SOLN
INTRAMUSCULAR | Status: AC
Start: 2023-08-30 — End: ?
  Filled 2023-08-30: qty 2

## 2023-08-30 MED ORDER — DEXAMETHASONE SODIUM PHOSPHATE 10 MG/ML IJ SOLN
INTRAMUSCULAR | Status: DC | PRN
Start: 2023-08-30 — End: 2023-08-30
  Administered 2023-08-30: 10 mg via INTRAVENOUS

## 2023-08-30 MED ORDER — LIDOCAINE-EPINEPHRINE 1 %-1:100000 IJ SOLN
INTRAMUSCULAR | Status: DC | PRN
  Administered 2023-08-30: 50 mL via INTRAMUSCULAR

## 2023-08-30 MED ORDER — VASHE WOUND IRRIGATION OPTIME
TOPICAL | Status: DC | PRN
  Administered 2023-08-30: 34 [oz_av]

## 2023-08-30 MED ORDER — CHLORHEXIDINE GLUCONATE CLOTH 2 % EX PADS: 6.0000 | MEDICATED_PAD | Freq: Once | CUTANEOUS | Status: DC

## 2023-08-30 MED ORDER — CEFAZOLIN SODIUM-DEXTROSE 2-4 GM/100ML-% IV SOLN
INTRAVENOUS | Status: AC
  Filled 2023-08-30: qty 100

## 2023-08-30 MED ORDER — SODIUM CHLORIDE 0.9 % IV SOLN
INTRAVENOUS | Status: DC | PRN
  Administered 2023-08-30: 40 mL

## 2023-08-30 MED ORDER — TRANEXAMIC ACID-NACL 1000-0.7 MG/100ML-% IV SOLN
INTRAVENOUS | Status: DC | PRN
Start: 2023-08-30 — End: 2023-08-30
  Administered 2023-08-30: 1000 mg via INTRAVENOUS

## 2023-08-30 MED ORDER — ONDANSETRON HCL 4 MG/2ML IJ SOLN
INTRAMUSCULAR | Status: AC
  Filled 2023-08-30: qty 2

## 2023-08-30 MED ORDER — KETAMINE HCL 10 MG/ML IJ SOLN
INTRAMUSCULAR | Status: DC | PRN
  Administered 2023-08-30: 30 mg via INTRAVENOUS

## 2023-08-30 MED ORDER — FENTANYL CITRATE (PF) 100 MCG/2ML IJ SOLN
INTRAMUSCULAR | Status: AC
  Filled 2023-08-30: qty 2

## 2023-08-30 MED ORDER — PROPOFOL 500 MG/50ML IV EMUL
INTRAVENOUS | Status: DC | PRN
  Administered 2023-08-30: 50 ug/kg/min via INTRAVENOUS

## 2023-08-30 MED ORDER — SCOPOLAMINE 1 MG/3DAYS TD PT72
MEDICATED_PATCH | TRANSDERMAL | Status: AC
Start: 2023-08-30 — End: ?
  Filled 2023-08-30: qty 1

## 2023-08-30 MED ORDER — PROMETHAZINE (PHENERGAN) 6.25MG IN NS 50ML IVPB
6.2500 mg | Freq: Once | INTRAVENOUS | Status: DC
  Filled 2023-08-30: qty 50

## 2023-08-30 MED ORDER — ONDANSETRON HCL 4 MG/2ML IJ SOLN
INTRAMUSCULAR | Status: DC | PRN
  Administered 2023-08-30: 4 mg via INTRAVENOUS

## 2023-08-30 MED ORDER — PHENYLEPHRINE 80 MCG/ML (10ML) SYRINGE FOR IV PUSH (FOR BLOOD PRESSURE SUPPORT)
PREFILLED_SYRINGE | INTRAVENOUS | Status: AC
  Filled 2023-08-30: qty 10

## 2023-08-30 MED ORDER — AMISULPRIDE (ANTIEMETIC) 5 MG/2ML IV SOLN
10.0000 mg | Freq: Once | INTRAVENOUS | Status: AC | PRN
  Administered 2023-08-30: 10 mg via INTRAVENOUS

## 2023-08-30 MED ORDER — LIDOCAINE HCL (CARDIAC) PF 100 MG/5ML IV SOSY
PREFILLED_SYRINGE | INTRAVENOUS | Status: DC | PRN
  Administered 2023-08-30: 60 mg via INTRAVENOUS

## 2023-08-30 MED ORDER — SUGAMMADEX SODIUM 200 MG/2ML IV SOLN
INTRAVENOUS | Status: DC | PRN
  Administered 2023-08-30: 200 mg via INTRAVENOUS

## 2023-08-30 MED ORDER — ACETAMINOPHEN 500 MG PO TABS
ORAL_TABLET | ORAL | Status: AC
  Filled 2023-08-30: qty 2

## 2023-08-30 MED ORDER — SCOPOLAMINE 1 MG/3DAYS TD PT72
1.0000 | MEDICATED_PATCH | TRANSDERMAL | Status: DC
  Administered 2023-08-30: 1.5 mg via TRANSDERMAL

## 2023-08-30 MED ORDER — ACETAMINOPHEN 500 MG PO TABS
1000.0000 mg | ORAL_TABLET | Freq: Once | ORAL | Status: AC
  Administered 2023-08-30: 1000 mg via ORAL

## 2023-08-30 MED ORDER — PROPOFOL 10 MG/ML IV BOLUS
INTRAVENOUS | Status: AC
  Filled 2023-08-30: qty 20

## 2023-08-30 MED ORDER — MIDAZOLAM HCL 5 MG/5ML IJ SOLN
INTRAMUSCULAR | Status: DC | PRN
  Administered 2023-08-30: 2 mg via INTRAVENOUS

## 2023-08-30 MED ORDER — ONDANSETRON 4 MG PO TBDP
4.0000 mg | ORAL_TABLET | Freq: Once | ORAL | Status: AC
  Administered 2023-08-30: 4 mg via ORAL

## 2023-08-30 MED ORDER — LIDOCAINE HCL 1 % IJ SOLN
INTRAVENOUS | Status: DC | PRN
  Administered 2023-08-30: 400 mL

## 2023-08-30 MED ORDER — DEXMEDETOMIDINE HCL IN NACL 80 MCG/20ML IV SOLN
INTRAVENOUS | Status: DC | PRN
  Administered 2023-08-30: 12 ug via INTRAVENOUS

## 2023-08-30 MED ORDER — DEXAMETHASONE SODIUM PHOSPHATE 10 MG/ML IJ SOLN
INTRAMUSCULAR | Status: AC
  Filled 2023-08-30: qty 1

## 2023-08-30 MED ORDER — SODIUM CHLORIDE 0.9 % IV SOLN
6.2500 mg | INTRAVENOUS | Status: DC
  Filled 2023-08-30: qty 0.25

## 2023-08-30 MED ORDER — CEFAZOLIN SODIUM-DEXTROSE 2-4 GM/100ML-% IV SOLN
2.0000 g | INTRAVENOUS | Status: AC
  Administered 2023-08-30: 2 g via INTRAVENOUS

## 2023-08-30 MED ORDER — LACTATED RINGERS IV SOLN: INTRAVENOUS | Status: DC

## 2023-08-30 MED ORDER — TRANEXAMIC ACID-NACL 1000-0.7 MG/100ML-% IV SOLN
INTRAVENOUS | Status: AC
  Filled 2023-08-30: qty 100

## 2023-08-30 MED ORDER — SODIUM CHLORIDE 0.9 % IV SOLN: 250.0000 mL | INTRAVENOUS | Status: DC | PRN

## 2023-08-30 MED ORDER — ONDANSETRON 4 MG PO TBDP
ORAL_TABLET | ORAL | Status: AC
  Filled 2023-08-30: qty 1

## 2023-08-30 MED ORDER — FENTANYL CITRATE (PF) 100 MCG/2ML IJ SOLN
INTRAMUSCULAR | Status: DC | PRN
  Administered 2023-08-30 (×3): 50 ug via INTRAVENOUS

## 2023-08-30 MED ORDER — KETOROLAC TROMETHAMINE 30 MG/ML IJ SOLN: 30.0000 mg | Freq: Once | INTRAMUSCULAR | Status: DC | PRN

## 2023-08-30 MED ORDER — PROPOFOL 10 MG/ML IV BOLUS
INTRAVENOUS | Status: DC | PRN
  Administered 2023-08-30: 200 mg via INTRAVENOUS

## 2023-08-30 SURGICAL SUPPLY — 65 items
BINDER BREAST LRG (GAUZE/BANDAGES/DRESSINGS) IMPLANT
BINDER BREAST MEDIUM (GAUZE/BANDAGES/DRESSINGS) IMPLANT
BINDER BREAST XLRG (GAUZE/BANDAGES/DRESSINGS) IMPLANT
BINDER BREAST XXLRG (GAUZE/BANDAGES/DRESSINGS) IMPLANT
BIOPATCH RED 1 DISK 7.0 (GAUZE/BANDAGES/DRESSINGS) IMPLANT
BLADE HEX COATED 2.75 (ELECTRODE) IMPLANT
BLADE KNIFE PERSONA 10 (BLADE) ×2 IMPLANT
BLADE SURG 15 STRL LF DISP TIS (BLADE) ×1 IMPLANT
CANISTER SUCT 1200ML W/VALVE (MISCELLANEOUS) ×1 IMPLANT
CLEANSER WND VASHE 34 (WOUND CARE) ×1 IMPLANT
COLLAGEN CELLERATERX 5 GRAM (Miscellaneous) IMPLANT
COVER BACK TABLE 60X90IN (DRAPES) ×1 IMPLANT
COVER MAYO STAND STRL (DRAPES) ×1 IMPLANT
DERMABOND ADVANCED .7 DNX12 (GAUZE/BANDAGES/DRESSINGS) ×2 IMPLANT
DRAIN CHANNEL 19F RND (DRAIN) IMPLANT
DRAPE LAPAROSCOPIC ABDOMINAL (DRAPES) ×1 IMPLANT
DRAPE UTILITY XL STRL (DRAPES) ×1 IMPLANT
DRSG MEPILEX POST OP 4X8 (GAUZE/BANDAGES/DRESSINGS) ×2 IMPLANT
DRSG TEGADERM 4X4.75 (GAUZE/BANDAGES/DRESSINGS) IMPLANT
ELECT BLADE 4.0 EZ CLEAN MEGAD (MISCELLANEOUS) ×1 IMPLANT
ELECT REM PT RETURN 9FT ADLT (ELECTROSURGICAL) ×1 IMPLANT
ELECTRODE BLDE 4.0 EZ CLN MEGD (MISCELLANEOUS) ×1 IMPLANT
ELECTRODE REM PT RTRN 9FT ADLT (ELECTROSURGICAL) ×1 IMPLANT
EVACUATOR SILICONE 100CC (DRAIN) IMPLANT
GAUZE PAD ABD 8X10 STRL (GAUZE/BANDAGES/DRESSINGS) ×2 IMPLANT
GLOVE BIO SURGEON STRL SZ 6.5 (GLOVE) ×2 IMPLANT
GLOVE BIO SURGEON STRL SZ7.5 (GLOVE) ×1 IMPLANT
GLOVE BIOGEL PI IND STRL 7.0 (GLOVE) IMPLANT
GLOVE BIOGEL PI IND STRL 8 (GLOVE) IMPLANT
GLOVE SURG SS PI 6.5 STRL IVOR (GLOVE) IMPLANT
GLOVE SURG SS PI 7.5 STRL IVOR (GLOVE) IMPLANT
GOWN STRL REUS W/ TWL LRG LVL3 (GOWN DISPOSABLE) ×2 IMPLANT
GOWN STRL REUS W/ TWL XL LVL3 (GOWN DISPOSABLE) IMPLANT
LINER CANISTER 1000CC FLEX (MISCELLANEOUS) ×1 IMPLANT
NDL FILTER BLUNT 18X1 1/2 (NEEDLE) IMPLANT
NDL HYPO 25X1 1.5 SAFETY (NEEDLE) ×2 IMPLANT
NEEDLE FILTER BLUNT 18X1 1/2 (NEEDLE) ×1 IMPLANT
NEEDLE HYPO 25X1 1.5 SAFETY (NEEDLE) ×2 IMPLANT
NS IRRIG 1000ML POUR BTL (IV SOLUTION) IMPLANT
PACK BASIN DAY SURGERY FS (CUSTOM PROCEDURE TRAY) ×1 IMPLANT
PAD ALCOHOL SWAB (MISCELLANEOUS) IMPLANT
PAD FOAM SILICONE BACKED (GAUZE/BANDAGES/DRESSINGS) IMPLANT
PENCIL SMOKE EVACUATOR (MISCELLANEOUS) ×1 IMPLANT
PIN SAFETY STERILE (MISCELLANEOUS) IMPLANT
SLEEVE SCD COMPRESS KNEE MED (STOCKING) ×1 IMPLANT
SPIKE FLUID TRANSFER (MISCELLANEOUS) IMPLANT
SPONGE T-LAP 18X18 ~~LOC~~+RFID (SPONGE) ×2 IMPLANT
STRIP SUTURE WOUND CLOSURE 1/2 (MISCELLANEOUS) ×4 IMPLANT
SUT MNCRL AB 4-0 PS2 18 (SUTURE) ×4 IMPLANT
SUT MON AB 3-0 SH27 (SUTURE) ×4 IMPLANT
SUT MON AB 5-0 PS2 18 (SUTURE) IMPLANT
SUT PDS 3-0 CT2 (SUTURE) ×4 IMPLANT
SUT PDS II 3-0 CT2 27 ABS (SUTURE) ×4 IMPLANT
SUT SILK 3 0 PS 1 (SUTURE) IMPLANT
SYR 50ML LL SCALE MARK (SYRINGE) IMPLANT
SYR BULB IRRIG 60ML STRL (SYRINGE) ×1 IMPLANT
SYR CONTROL 10ML LL (SYRINGE) ×2 IMPLANT
TAPE MEASURE VINYL STERILE (MISCELLANEOUS) IMPLANT
TOWEL GREEN STERILE FF (TOWEL DISPOSABLE) ×3 IMPLANT
TRAY DSU PREP LF (CUSTOM PROCEDURE TRAY) ×1 IMPLANT
TUBE CONNECTING 20X1/4 (TUBING) ×1 IMPLANT
TUBING INFILTRATION IT-10001 (TUBING) IMPLANT
TUBING SET GRADUATE ASPIR 12FT (MISCELLANEOUS) IMPLANT
UNDERPAD 30X36 HEAVY ABSORB (UNDERPADS AND DIAPERS) ×2 IMPLANT
YANKAUER SUCT BULB TIP NO VENT (SUCTIONS) ×1 IMPLANT

## 2023-08-30 NOTE — Anesthesia Procedure Notes (Signed)
 Procedure Name: Intubation Date/Time: 08/30/2023 12:10 PM  Performed by: Thornell Mule, CRNAPre-anesthesia Checklist: Patient identified, Emergency Drugs available, Suction available and Patient being monitored Patient Re-evaluated:Patient Re-evaluated prior to induction Oxygen Delivery Method: Circle system utilized Preoxygenation: Pre-oxygenation with 100% oxygen Induction Type: IV induction Ventilation: Mask ventilation without difficulty Laryngoscope Size: Miller and 3 Grade View: Grade I Tube type: Oral Tube size: 7.0 mm Number of attempts: 1 Airway Equipment and Method: Stylet and Oral airway Placement Confirmation: ETT inserted through vocal cords under direct vision, positive ETCO2 and breath sounds checked- equal and bilateral Secured at: 21 cm Tube secured with: Tape Dental Injury: Teeth and Oropharynx as per pre-operative assessment

## 2023-08-30 NOTE — Interval H&P Note (Signed)
 History and Physical Interval Note:  08/30/2023 11:16 AM  Mariah Vaughn  has presented today for surgery, with the diagnosis of Hypertrophy of breast.  The various methods of treatment have been discussed with the patient and family. After consideration of risks, benefits and other options for treatment, the patient has consented to  Procedure(s): BREAST REDUCTION WITH LIPOSUCTION (Bilateral) as a surgical intervention.  The patient's history has been reviewed, patient examined, no change in status, stable for surgery.  I have reviewed the patient's chart and labs.  Questions were answered to the patient's satisfaction.     Alena Bills Nyelle Wolfson

## 2023-08-30 NOTE — Transfer of Care (Signed)
 Immediate Anesthesia Transfer of Care Note  Patient: Mariah Vaughn  Procedure(s) Performed: BREAST REDUCTION WITH LIPOSUCTION (Bilateral: Breast)  Patient Location: PACU  Anesthesia Type:General  Level of Consciousness: drowsy and patient cooperative  Airway & Oxygen Therapy: Patient Spontanous Breathing and Patient connected to face mask oxygen  Post-op Assessment: Report given to RN and Post -op Vital signs reviewed and stable  Post vital signs: Reviewed and stable  Last Vitals:  Vitals Value Taken Time  BP    Temp    Pulse 89 08/30/23 1427  Resp 15 08/30/23 1427  SpO2 100 % 08/30/23 1427  Vitals shown include unfiled device data.  Last Pain:  Vitals:   08/30/23 0930  TempSrc: Temporal  PainSc: 0-No pain      Patients Stated Pain Goal: 3 (08/30/23 0930)  Complications: No notable events documented.

## 2023-08-30 NOTE — Anesthesia Postprocedure Evaluation (Signed)
 Anesthesia Post Note  Patient: Mariah Vaughn  Procedure(s) Performed: BREAST REDUCTION WITH LIPOSUCTION (Bilateral: Breast)     Patient location during evaluation: PACU Anesthesia Type: General Level of consciousness: awake and alert Pain management: pain level controlled Vital Signs Assessment: post-procedure vital signs reviewed and stable Respiratory status: spontaneous breathing, nonlabored ventilation, respiratory function stable and patient connected to nasal cannula oxygen Cardiovascular status: blood pressure returned to baseline and stable Anesthetic complications: no   No notable events documented.  Last Vitals:  Vitals:   08/30/23 1545 08/30/23 1600  BP: 119/81 123/83  Pulse: 92 93  Resp: 12 (!) 23  Temp:    SpO2: 95% 95%    Last Pain:  Vitals:   08/30/23 1530  TempSrc:   PainSc: 1                  Collene Schlichter

## 2023-08-30 NOTE — Discharge Instructions (Addendum)
 INSTRUCTIONS FOR AFTER BREAST SURGERY   You will likely have some questions about what to expect following your operation.  The following information will help you and your family understand what to expect when you are discharged from the hospital.  It is important to follow these guidelines to help ensure a smooth recovery and reduce complication.  Postoperative instructions include information on: diet, wound care, medications and physical activity.  AFTER SURGERY Expect to go home after the procedure.  In some cases, you may need to spend one night in the hospital for observation.  DIET Breast surgery does not require a specific diet.  However, the healthier you eat the better your body will heal. It is important to increasing your protein intake.  This means limiting the foods with sugar and carbohydrates.  Focus on vegetables and some meat.  If you have liposuction during your procedure be sure to drink water.  If your urine is bright yellow, then it is concentrated, and you need to drink more water.  As a general rule after surgery, you should have 8 ounces of water every hour while awake.  If you find you are persistently nauseated or unable to take in liquids let us know.  NO TOBACCO USE or EXPOSURE.  This will slow your healing process and lead to a wound.  WOUND CARE Leave the binder on for 3 days . Use fragrance free soap like Dial, Dove or [place].   After 3 days you can remove the binder to shower. Once dry apply binder or sports bra. If you have liposuction you will have a soft and spongy dressing (Lipofoam) that helps prevent creases in your skin.  Remove before you shower and then replace it.  It is also available on [employer]. If you have steri-strips / tape directly attached to your skin leave them in place. It is OK to get these wet.   No baths, pools or hot tubs for four weeks. We close your incision to leave the smallest and best-looking scar. No ointment or creams on your incisions  for four weeks.  No Neosporin (Too many skin reactions).  A few weeks after surgery you can use Mederma and start massaging the scar. We ask you to wear your binder or sports bra for the first 6 weeks around the clock, including while sleeping. This provides added comfort and helps reduce the fluid accumulation at the surgery site. NO Ice or heating pads to the operative site.  You have a very high risk of a BURN before you feel the temperature change.  ACTIVITY No heavy lifting until cleared by the doctor.  This usually means no more than a half-gallon of milk.  It is OK to walk and climb stairs. Moving your legs is very important to decrease your risk of a blood clot.  It will also help keep you from getting deconditioned.  Every 1 to 2 hours get up and walk for 5 minutes. This will help with a quicker recovery back to normal.  Let pain be your guide so you don't do too much.  This time is for you to recover.  You will be more comfortable if you sleep and rest with your head elevated either with a few pillows under you or in a recliner.  No stomach sleeping for a three months.  WORK Everyone returns to work at different times. As a rough guide, most people take at least 1 - 2 weeks off prior to returning to work. If  you need documentation for your job, give the forms to the front staff at the clinic.  DRIVING Arrange for someone to bring you home from the hospital after your surgery.  You may be able to drive a few days after surgery but not while taking any narcotics or valium.  BOWEL MOVEMENTS Constipation can occur after anesthesia and while taking pain medication.  It is important to stay ahead for your comfort.  We recommend taking Milk of Magnesia (2 tablespoons; twice a day) while taking the pain pills.  MEDICATIONS You may be prescribed should start after surgery At your preoperative visit for you history and physical you may have been given the following medications: An antibiotic: Start  this medication when you get home and take according to the instructions on the bottle. Zofran 4 mg:  This is to treat nausea and vomiting.  You can take this every 6 hours as needed and only if needed. Valium 2 mg for breast cancer patients: This is for muscle tightness if you have an implant or expander. This will help relax your muscle which also helps with pain control.  This can be taken every 12 hours as needed. Don't drive after taking this medication. Norco (hydrocodone/acetaminophen) 5/325 mg:  This is only to be used after you have taken the Motrin or the Tylenol. Every 8 hours as needed.   Over the counter Medication to take: Ibuprofen (Motrin) 600 mg:  Take this every 6 hours.  If you have additional pain then take 500 mg of the Tylenol every 8 hours.  Only take the Norco after you have tried these two. MiraLAX or Milk of Magnesia: Take this according to the bottle if you take the Norco.  WHEN TO CALL Call your surgeon's office if any of the following occur: Fever 101 degrees F or greater Excessive bleeding or fluid from the incision site. Pain that increases over time without aid from the medications Redness, warmth, or pus draining from incision sites Persistent nausea or inability to take in liquids Severe misshapen area that underwent the operation.  Information for Discharge Teaching: EXPAREL (bupivacaine liposome injectable suspension)   Pain relief is important to your recovery. The goal is to control your pain so you can move easier and return to your normal activities as soon as possible after your procedure. Your physician may use several types of medicines to manage pain, swelling, and more.  Your surgeon or anesthesiologist gave you EXPAREL(bupivacaine) to help control your pain after surgery.  EXPAREL is a local anesthetic designed to release slowly over an extended period of time to provide pain relief by numbing the tissue around the surgical site. EXPAREL is  designed to release pain medication over time and can control pain for up to 72 hours. Depending on how you respond to EXPAREL, you may require less pain medication during your recovery. EXPAREL can help reduce or eliminate the need for opioids during the first few days after surgery when pain relief is needed the most. EXPAREL is not an opioid and is not addictive. It does not cause sleepiness or sedation.   Important! A teal colored band has been placed on your arm with the date, time and amount of EXPAREL you have received. Please leave this armband in place for the full 96 hours following administration, and then you may remove the band. If you return to the hospital for any reason within 96 hours following the administration of EXPAREL, the armband provides important information that your  health care providers to know, and alerts them that you have received this anesthetic.    Possible side effects of EXPAREL: Temporary loss of sensation or ability to move in the area where medication was injected. Nausea, vomiting, constipation Rarely, numbness and tingling in your mouth or lips, lightheadedness, or anxiety may occur. Call your doctor right away if you think you may be experiencing any of these sensations, or if you have other questions regarding possible side effects.  Follow all other discharge instructions given to you by your surgeon or nurse. Eat a healthy diet and drink plenty of water or other fluids.   Post Anesthesia Home Care Instructions  Activity: Get plenty of rest for the remainder of the day. A responsible individual must stay with you for 24 hours following the procedure.  For the next 24 hours, DO NOT: -Drive a car -Advertising copywriter -Drink alcoholic beverages -Take any medication unless instructed by your physician -Make any legal decisions or sign important papers.  Meals: Start with liquid foods such as gelatin or soup. Progress to regular foods as tolerated.  Avoid greasy, spicy, heavy foods. If nausea and/or vomiting occur, drink only clear liquids until the nausea and/or vomiting subsides. Call your physician if vomiting continues.  Special Instructions/Symptoms: Your throat may feel dry or sore from the anesthesia or the breathing tube placed in your throat during surgery. If this causes discomfort, gargle with warm salt water. The discomfort should disappear within 24 hours.  If you had a scopolamine patch placed behind your ear for the management of post- operative nausea and/or vomiting:  1. The medication in the patch is effective for 72 hours, after which it should be removed.  Wrap patch in a tissue and discard in the trash. Wash hands thoroughly with soap and water. 2. You may remove the patch earlier than 72 hours if you experience unpleasant side effects which may include dry mouth, dizziness or visual disturbances. 3. Avoid touching the patch. Wash your hands with soap and water after contact with the patch.    Tylenol can be taken after 4:10 pm if needed

## 2023-08-30 NOTE — Op Note (Signed)
 Breast Reduction Op note:    DATE OF PROCEDURE: 08/30/2023  LOCATION: [facility]  SURGEON: Foster Simpson, DO  ASSISTANT: Caroline More, PA  PREOPERATIVE DIAGNOSIS 1. Macromastia 2. Neck Pain 3. Back Pain  POSTOPERATIVE DIAGNOSIS 1. Macromastia 2. Neck Pain 3. Back Pain  PROCEDURES 1. Bilateral breast reduction.  Right reduction 930 g, Left reduction 924 g  COMPLICATIONS: None.  DRAINS: none  INDICATIONS FOR PROCEDURE Mariah Vaughn is a 58 y.o. year-old female born on 1965-10-26,with a history of symptomatic macromastia with concomitant back pain, neck pain, shoulder grooving from her bra.   MRN: [id number]  CONSENT Informed consent was obtained directly from the patient. The risks, benefits and alternatives were fully discussed. Specific risks including but not limited to bleeding, infection, hematoma, seroma, scarring, pain, nipple necrosis, asymmetry, poor cosmetic results, and need for further surgery were discussed. The patient's questions were answered.  DESCRIPTION OF PROCEDURE  Patient was brought into the operating room and rested on the operating room table in the supine position.  SCDs were placed and appropriate padding was performed.  Antibiotics were given. The patient underwent general anesthesia and the chest was prepped and draped in a sterile fashion.  A timeout was performed and all information was confirmed to be correct by those in the room.Tumescent was placed in each lateral breast.  Liposuction was done laterally to improve contour and symmetry.  Right side: Preoperative markings were confirmed.  Incision lines were injected with local containing epinephrine.  After waiting for vasoconstriction, the marked lines were incised with a #15 blade.  A Wise-pattern superomedial breast reduction was performed by de-epithelializing the pedicle, using bovie to create the superomedial pedicle, and removing breast tissue from the  superior, lateral, and inferior portions of the breast.  Care was taken to not undermine the breast pedicle. Hemostasis was achieved.  The nipple was gently rotated into position and the soft tissue closed with 4-0 Monocryl.   The pocket was irrigated and hemostasis confirmed.  The deep tissues were approximated with 3-0 PDS sutures.  The skin was closed with deep dermal 3-0 Monocryl and subcuticular 4-0 Monocryl sutures.  The nipple and skin flaps had good capillary refill at the end of the procedure.    Left side: Preoperative markings were confirmed.  Incision lines were injected with local containing epinephrine.  After waiting for vasoconstriction, the marked lines were incised with a #15 blade.  A Wise-pattern superomedial breast reduction was performed by de-epithelializing the pedicle, using bovie to create the superomedial pedicle, and removing breast tissue from the superior, lateral, and inferior portions of the breast.  Care was taken to not undermine the breast pedicle. Hemostasis was achieved.  The nipple was gently rotated into position and the soft tissue was closed with 4-0 Monocryl.  The patient was sat upright and size and shape symmetry was confirmed. Experel and Cellerate were placed in each breat pocket.  The pocket was irrigated and hemostasis confirmed.  The deep tissues were approximated with 3-0 PDS sutures. The skin was closed with deep dermal 3-0 Monocryl and subcuticular 4-0 Monocryl sutures.  Dermabond was applied.  A breast binder and ABDs were placed.  The nipple and skin flaps had good capillary refill at the end of the procedure.  The patient tolerated the procedure well. The patient was allowed to wake from anesthesia and taken to the recovery room in satisfactory condition.  The advanced practice practitioner (APP) assisted throughout the case.  The APP was essential  in retraction and counter traction when needed to make the case progress smoothly.  This retraction and  assistance made it possible to see the tissue plans for the procedure.  The assistance was needed for blood control, tissue re-approximation and assisted with closure of the incision site.

## 2023-08-30 NOTE — Anesthesia Preprocedure Evaluation (Addendum)
 Anesthesia Evaluation  Patient identified by MRN, date of birth, ID band Patient awake    Reviewed: Allergy & Precautions, NPO status , Patient's Chart, lab work & pertinent test results  History of Anesthesia Complications (+) PONV and history of anesthetic complications  Airway Mallampati: II  TM Distance: >3 FB Neck ROM: Full    Dental no notable dental hx.    Pulmonary asthma , sleep apnea , former smoker   Pulmonary exam normal breath sounds clear to auscultation       Cardiovascular negative cardio ROS Normal cardiovascular exam Rhythm:Regular Rate:Normal  PFO (patent foramen ovale)   Neuro/Psych  PSYCHIATRIC DISORDERS Anxiety Depression Bipolar Disorder   negative neurological ROS     GI/Hepatic negative GI ROS, Neg liver ROS,,,  Endo/Other  negative endocrine ROS    Renal/GU negative Renal ROS     Musculoskeletal negative musculoskeletal ROS (+)    Abdominal  (+) + obese  Peds  Hematology negative hematology ROS (+)   Anesthesia Other Findings Hypertrophy of breast  Reproductive/Obstetrics                             Anesthesia Physical Anesthesia Plan  ASA: 3  Anesthesia Plan: General   Post-op Pain Management:    Induction: Intravenous  PONV Risk Score and Plan: 3 and Ondansetron, Dexamethasone, Midazolam, Treatment may vary due to age or medical condition, Scopolamine patch - Pre-op and Propofol infusion  Airway Management Planned: Oral ETT  Additional Equipment:   Intra-op Plan:   Post-operative Plan: Extubation in OR  Informed Consent: I have reviewed the patients History and Physical, chart, labs and discussed the procedure including the risks, benefits and alternatives for the proposed anesthesia with the patient or authorized representative who has indicated his/her understanding and acceptance.     Dental advisory given  Plan Discussed with:  CRNA  Anesthesia Plan Comments:        Anesthesia Quick Evaluation

## 2023-08-31 ENCOUNTER — Encounter (HOSPITAL_BASED_OUTPATIENT_CLINIC_OR_DEPARTMENT_OTHER): Payer: Self-pay | Admitting: Plastic Surgery

## 2023-08-31 ENCOUNTER — Telehealth: Payer: Self-pay | Admitting: Plastic Surgery

## 2023-08-31 NOTE — Telephone Encounter (Signed)
 Spoke with pt. Answered her questions.  She is doing well, not having issues.

## 2023-08-31 NOTE — Telephone Encounter (Signed)
 Patient was told by someone at the recovery center that Dr. D would call her or her son, but never did receive one. She would like to speak to some one regarding her surgery on 4-10, please reach out and advise.

## 2023-09-01 ENCOUNTER — Ambulatory Visit: Admitting: Student

## 2023-09-01 DIAGNOSIS — N62 Hypertrophy of breast: Secondary | ICD-10-CM

## 2023-09-01 LAB — SURGICAL PATHOLOGY

## 2023-09-01 NOTE — Progress Notes (Signed)
 Patient is a 58 year old female who underwent bilateral breast reduction with Dr. Ulice Bold on 08/30/2023.  Intraoperatively, patient had 930 g removed from the right breast and 924 g removed from the left breast.  Today, patient reports she is doing well.  She states that she is a little bit sore.  Patient states that she has been taking Tylenol and has not needed to take any of the pain medication.  She states that she feels the binder is tight and makes it difficult to take a deep breath in.  She denies any shortness of breath or chest pain.  She denies any swelling to her lower extremities.  She reports that she has been up and ambulating every hour or so without any difficulty.  Patient reports that she was experiencing nausea and vomiting postoperatively in PACU.  She states that she had been taking the Zofran up until yesterday and reports she is doing much better.  She reports she is eating and drinking without any issue.  She states that she has been voiding and having bowel movements.  She does not report any fevers or chills.  I recommended that patient switch from her binder to a sports bra to see if that might help with some of the soreness and tightness.  I discussed with her that if she develops any shortness of breath or difficulty breathing she needs to go to the emergency room.  Patient expressed understanding.  I answered all of the questions to the patient's satisfaction.  Patient to follow-up at her scheduled appointment next week.  I instructed her to call in the meantime if she has any questions or concerns about anything.

## 2023-09-04 ENCOUNTER — Emergency Department (HOSPITAL_COMMUNITY)

## 2023-09-04 ENCOUNTER — Emergency Department (HOSPITAL_COMMUNITY)
Admission: EM | Admit: 2023-09-04 | Discharge: 2023-09-04 | Disposition: A | Discharge status: Home / Self Care | Attending: Emergency Medicine | Admitting: Emergency Medicine

## 2023-09-04 ENCOUNTER — Other Ambulatory Visit: Payer: Self-pay

## 2023-09-04 ENCOUNTER — Telehealth: Payer: Self-pay | Admitting: Plastic Surgery

## 2023-09-04 ENCOUNTER — Encounter (HOSPITAL_COMMUNITY): Payer: Self-pay

## 2023-09-04 DIAGNOSIS — R0602 Shortness of breath: Secondary | ICD-10-CM | POA: Insufficient documentation

## 2023-09-04 DIAGNOSIS — R079 Chest pain, unspecified: Secondary | ICD-10-CM | POA: Insufficient documentation

## 2023-09-04 DIAGNOSIS — Z91040 Latex allergy status: Secondary | ICD-10-CM | POA: Diagnosis not present

## 2023-09-04 DIAGNOSIS — J45909 Unspecified asthma, uncomplicated: Secondary | ICD-10-CM | POA: Insufficient documentation

## 2023-09-04 DIAGNOSIS — Z7951 Long term (current) use of inhaled steroids: Secondary | ICD-10-CM | POA: Diagnosis not present

## 2023-09-04 LAB — BASIC METABOLIC PANEL WITH GFR
Anion gap: 10 (ref 5–15)
BUN: 11 mg/dL (ref 6–20)
CO2: 23 mmol/L (ref 22–32)
Calcium: 9.2 mg/dL (ref 8.9–10.3)
Chloride: 104 mmol/L (ref 98–111)
Creatinine, Ser: 0.66 mg/dL (ref 0.44–1.00)
GFR, Estimated: >60 mL/min (ref >60)
Glucose, Bld: 115 mg/dL — ABNORMAL HIGH (ref 70–99)
Potassium: 4.5 mmol/L (ref 3.5–5.1)
Sodium: 137 mmol/L (ref 135–145)

## 2023-09-04 LAB — TROPONIN I (HIGH SENSITIVITY)
Troponin I (High Sensitivity): <2 ng/L (ref <18)
Troponin I (High Sensitivity): 3 ng/L (ref <18)

## 2023-09-04 LAB — URINALYSIS, ROUTINE W REFLEX MICROSCOPIC
Bilirubin Urine: NEGATIVE
Glucose, UA: NEGATIVE mg/dL
Hgb urine dipstick: NEGATIVE
Ketones, ur: NEGATIVE mg/dL
Leukocytes,Ua: NEGATIVE
Nitrite: NEGATIVE
Protein, ur: NEGATIVE mg/dL
Specific Gravity, Urine: >1.046 — ABNORMAL HIGH (ref 1.005–1.030)
pH: 7 (ref 5.0–8.0)

## 2023-09-04 LAB — CBC
HCT: 40.7 % (ref 36.0–46.0)
Hemoglobin: 12.8 g/dL (ref 12.0–15.0)
MCH: 28.4 pg (ref 26.0–34.0)
MCHC: 31.4 g/dL (ref 30.0–36.0)
MCV: 90.4 fL (ref 80.0–100.0)
Platelets: 352 10*3/uL (ref 150–400)
RBC: 4.5 MIL/uL (ref 3.87–5.11)
RDW: 13.1 % (ref 11.5–15.5)
WBC: 7.3 10*3/uL (ref 4.0–10.5)
nRBC: 0 % (ref 0.0–0.2)

## 2023-09-04 MED ORDER — IOHEXOL 350 MG/ML SOLN: 80.0000 mL | Freq: Once | INTRAVENOUS | Status: DC | PRN

## 2023-09-04 MED ORDER — IOHEXOL 350 MG/ML SOLN
75.0000 mL | Freq: Once | INTRAVENOUS | Status: AC | PRN
  Administered 2023-09-04: 75 mL via INTRAVENOUS

## 2023-09-04 NOTE — ED Triage Notes (Addendum)
 Pt. Stated,, I had a lipo suction and breast reduction last Wednesday. Last night I started having SOB, chest pain like going to my back. I called my Dr. He said to come here. I took 1000 mg Tylenol one hour ago

## 2023-09-04 NOTE — ED Notes (Signed)
Pt given food and beverage 

## 2023-09-04 NOTE — ED Provider Notes (Signed)
 Patient's care assumed by me at 3:30 PM.  Patient is pending a CT scan of her chest to rule out PE.  Patient had breast reduction surgery on 4 9.  Patient has reported pain in her chest and feeling that she cannot take a deep breath since last night.  Patient reports that she is only taking Tylenol for pain.  She has tramadol but is afraid to take because she has a family member who has had substance abuse issues.  Patient is also concerned that the tramadol could interact with the lithium she is taking.  I reviewed interactions and there are no significant interactions except increase serotonin.  I think it is unlikely that patient will react from A PRN tramadol.  I advised patient to check with her pharmacist if any other concerns.  Of note pharmacist has already filled the medication Patient counseled on results of the CT.  I advised her of fluid pockets.  She does not have an elevation in her white blood cell count she is not febrile.  Patient is scheduled to see Dr. Orin Birk for recheck on Friday.  I advised her if she starts having fever chills worsening symptoms to return to the emergency department.  Follow-up with Dr. Orin Birk sooner if any concerns of infection.   Sandi Crosby, PA-C 09/04/23 1747    Tonya Fredrickson, MD 09/05/23 1037

## 2023-09-04 NOTE — Telephone Encounter (Signed)
 Patient calling in stating that she has been having back spasms since yesterday. She wants to know if that is normal? Call back number is [phone]

## 2023-09-04 NOTE — ED Provider Notes (Signed)
 [place] EMERGENCY DEPARTMENT AT [facility] Provider Note   CSN: [id number] Arrival date & time: 09/04/23  6962     History  Chief Complaint  Patient presents with   Shortness of Breath   Chest Pain   HPI Mariah Vaughn is a 58 y.o. female s/p breast reduction surgery last Wednesday, history of asthma, bipolar 2 disorder, PFO presenting for chest pain and shortness of breath.  Symptoms started last night.  She states that she was acutely short of breath and started to feel pain all about her upper back.  She describes it as a "stabbing pain".  This morning she woke up and felt the pain was more in the area of her chest.  She states it continues to feel "like someone is stabbing me".  It is worse with deep breathing.  Also worse with exertion.  Denies lower extremity swelling or calf tenderness.  She took "extra strength Tylenol" an hour before she arrived which helped somewhat.     Shortness of Breath Associated symptoms: chest pain   Chest Pain Associated symptoms: shortness of breath        Home Medications Prior to Admission medications   Medication Sig Start Date End Date Taking? Authorizing Provider  acetaminophen (TYLENOL) 500 MG tablet Take 1,000 mg by mouth every 6 (six) hours as needed for mild pain, fever or headache.   Yes [provider]  acyclovir (ZOVIRAX) 400 MG tablet Take 400 mg by mouth 3 (three) times daily as needed (For 10 days as needed for outbreak).   Yes [provider]  albuterol (PROVENTIL HFA;VENTOLIN HFA) 108 (90 Base) MCG/ACT inhaler Inhale 2 puffs into the lungs every 4 (four) hours as needed for wheezing or shortness of breath.   Yes [provider]  ARIPiprazole (ABILIFY) 5 MG tablet Take 2.5 mg by mouth daily. 07/25/19  Yes [provider]  BREO ELLIPTA 200-25 MCG/ACT AEPB TAKE 1 PUFF BY MOUTH EVERY DAY Patient taking differently: Inhale 1 puff into the lungs daily. 01/16/23  Yes Icard, Bradley L, DO   citalopram (CELEXA) 10 MG tablet Take 2.5 mg by mouth daily.   Yes [provider]  doxycycline (ADOXA) 100 MG tablet Take 100 mg by mouth 2 (two) times daily. 08/31/23 09/05/23 Yes [provider]  fexofenadine (ALLEGRA) 180 MG tablet Take 180 mg by mouth daily as needed for allergies or rhinitis.   Yes [provider]  fluticasone (FLONASE SENSIMIST) 27.5 MCG/SPRAY nasal spray Place 1 spray into the nose daily as needed for rhinitis or allergies. 03/16/22  Yes [provider]  lithium carbonate (LITHOBID) 300 MG CR tablet Take 600 mg by mouth at bedtime. 03/24/20  Yes [provider]  LORazepam (ATIVAN) 0.5 MG tablet Take 0.25-0.5 mg by mouth every 6 (six) hours as needed for anxiety, seizure or sedation.   Yes [provider]  meclizine (ANTIVERT) 25 MG tablet Take 25 mg by mouth 2 (two) times daily as needed for dizziness or nausea. 03/02/22  Yes [provider]  methocarbamol (ROBAXIN) 750 MG tablet Take 750 mg by mouth every 6 (six) hours as needed for muscle spasms. 06/09/22  Yes [provider]  ondansetron (ZOFRAN) 4 MG tablet Take 1 tablet (4 mg total) by mouth every 8 (eight) hours as needed for up to 20 doses for nausea or vomiting. 08/17/23  Yes Laurena Spies, PA-C  traMADol (ULTRAM) 50 MG tablet Take 1 tablet (50 mg total) by mouth every  8 (eight) hours as needed for up to 20 doses for moderate pain (pain score 4-6) or severe pain (pain score 7-10). 08/17/23  Yes Harden Leyden, PA-C  estradiol (ESTRACE) 0.5 MG tablet Take 0.5 mg by mouth daily. Patient not taking: Reported on 09/04/2023    [provider]  progesterone (PROMETRIUM) 100 MG capsule Take 100 mg by mouth daily. Patient not taking: Reported on 09/04/2023    [provider]      Allergies    Cefaclor, Codeine, Influenza vaccines, Oxycodone-acetaminophen, Penicillins, Sulfa antibiotics, Cephalexin, Flonase [fluticasone propionate],  Tamiflu [oseltamivir phosphate], and Latex    Review of Systems   Review of Systems  Respiratory:  Positive for shortness of breath.   Cardiovascular:  Positive for chest pain.    Physical Exam Updated Vital Signs BP 109/66   Pulse 88   Temp 97.6 F (36.4 C) (Oral)   Resp (!) 24   Ht 5' 3.5" (1.613 m)   Wt 80.7 kg   LMP  (LMP Unknown) Comment: no periods >46yrs  SpO2 100%   BMI 31.02 kg/m  Physical Exam Vitals and nursing note reviewed.  HENT:     Head: Normocephalic and atraumatic.     Mouth/Throat:     Mouth: Mucous membranes are moist.  Eyes:     General:        Right eye: No discharge.        Left eye: No discharge.     Conjunctiva/sclera: Conjunctivae normal.  Cardiovascular:     Rate and Rhythm: Normal rate and regular rhythm.     Pulses: Normal pulses.          Radial pulses are 2+ on the right side and 2+ on the left side.       Dorsalis pedis pulses are 2+ on the right side and 2+ on the left side.     Heart sounds: Normal heart sounds.  Pulmonary:     Effort: Pulmonary effort is normal.     Breath sounds: Normal breath sounds.  Abdominal:     General: Abdomen is flat.     Palpations: Abdomen is soft.  Skin:    General: Skin is warm and dry.  Neurological:     General: No focal deficit present.  Psychiatric:        Mood and Affect: Mood normal.     ED Results / Procedures / Treatments   Labs (all labs ordered are listed, but only abnormal results are displayed) Labs Reviewed  BASIC METABOLIC PANEL WITH GFR - Abnormal; Notable for the following components:      Result Value   Glucose, Bld 115 (*)    All other components within normal limits  CBC  URINALYSIS, ROUTINE W REFLEX MICROSCOPIC  TROPONIN I (HIGH SENSITIVITY)  TROPONIN I (HIGH SENSITIVITY)    EKG EKG Interpretation Date/Time:  Monday September 04 2023 10:09:31 EDT Ventricular Rate:  88 PR Interval:  156 QRS Duration:  80 QT Interval:  356 QTC Calculation: 430 R Axis:   60  Text  Interpretation: Normal sinus rhythm Normal ECG When compared with ECG of 29-Jun-2023 16:26, No significant change since last tracing Confirmed by Auston Blush [phone]) on 09/04/2023 10:13:09 AM  Radiology DG Chest 2 View Result Date: 09/04/2023 CLINICAL DATA:  Shortness of breath. History of recent breast reduction surgery 5 days ago. EXAM: CHEST - 2 VIEW COMPARISON:  Chest radiograph dated 06/09/2022. FINDINGS: The heart size and mediastinal contours are within normal limits. Mild left  basilar atelectasis. No focal consolidation, pleural effusion, or pneumothorax. No acute osseous abnormality. IMPRESSION: Mild left basilar atelectasis. Otherwise, no acute cardiopulmonary findings. Electronically Signed   By: Mannie Seek M.D.   On: 09/04/2023 11:18    Procedures Procedures    Medications Ordered in ED Medications  iohexol (OMNIPAQUE) 350 MG/ML injection 80 mL ( Intravenous Canceled Entry 09/04/23 1345)  iohexol (OMNIPAQUE) 350 MG/ML injection 75 mL (75 mLs Intravenous Contrast Given 09/04/23 1345)    ED Course/ Medical Decision Making/ A&P                                 Medical Decision Making Amount and/or Complexity of Data Reviewed Labs: ordered. Radiology: ordered.  Risk Prescription drug management.   58 year old well-appearing female presenting for chest pain.  Exam was unremarkable.  DDx includes PE, ACS, pneumonia, pneumothorax, aortic dissection, other.  Labs unremarkable, workup overall unremarkable.  Chest x-ray without any acute cardiopulmonary findings.  Given recent surgery and pleuritic chest pain, primary concern is PE.  CT angio is pending.  She remains hemodynamically stable, well-appearing in no acute distress.  If CT is unremarkable, patient can be discharged with PCP follow-up.  Signed out patient to PA Lynnie Saucier.        Final Clinical Impression(s) / ED Diagnoses Final diagnoses:  Chest pain, unspecified type    Rx / DC Orders ED Discharge  Orders     None         Janalee Mcmurray, PA-C 09/04/23 1606    Auston Blush, MD 09/07/23 [phone]

## 2023-09-04 NOTE — Discharge Instructions (Signed)
 Return if any problems.

## 2023-09-04 NOTE — ED Notes (Signed)
 Patient verbalizes understanding of instructions with no additional questions. Patient ambulatory to lobby at time of discharge.

## 2023-09-08 ENCOUNTER — Encounter: Payer: Self-pay | Admitting: Student

## 2023-09-08 ENCOUNTER — Ambulatory Visit (INDEPENDENT_AMBULATORY_CARE_PROVIDER_SITE_OTHER): Admitting: Student

## 2023-09-08 VITALS — BP 122/77 | HR 95 | Ht 63.5 in | Wt 177.2 lb

## 2023-09-08 DIAGNOSIS — N62 Hypertrophy of breast: Secondary | ICD-10-CM

## 2023-09-08 NOTE — Progress Notes (Signed)
 Patient is a 59 year old female who underwent bilateral breast reduction with Dr. Orin Birk on 08/30/2023.  She is a little over 1 week postop.  She presents to the clinic today for postoperative follow-up.  Per chart review, patient went to the emergency room for chest pain and shortness of breath.  Patient was afebrile and did not have an elevation in her white blood cell count.  CT scan showed no evidence of a pulmonary embolism.  There were some small 2 cm fluid collection in the left breast.  Today, patient reports she is doing okay.  She states that she is sore, especially on the sides.  She states that she has been taking Tylenol  for her pain and has not taken any pain medications.  She states that due to the pain, she feels somewhat short of breath.  She states that her shortness of breath has not worsened since being seen in the emergency room.  She states that she feels it is more difficulty taking a deep breath due to the pain from surgery.  She denies any chest pain.  She states she had some nausea and vomiting after surgery, but she states that the last time she vomited was 2 days ago.  She states that she has been eating and drinking without issue and able to keep food and water down.  She reports she has been taking the Zofran  which has been helping.  Patient also states that the adhesive from the Mepilex and the Lipo foam is bothering her.  Vitals:   09/08/23 1012  BP: 122/77  Pulse: 95  SpO2: 96%  Patient's vitals are stable.  Chaperone present on exam.  On exam, patient is sitting upright in no acute distress.  Her breathing is unlabored and she is in no respiratory distress.  Breasts are soft and symmetric.  There is no overlying erythema.  There is some ecchymosis noted bilaterally.  Some mild swelling noted bilaterally as well.  NAC's appear to be healthy.  There are no obvious fluid collections palpated on exam.  Mepilex border dressings are in place and are clean dry and intact.   These were removed.  Steri-Strips are in place over the incisions and appear to be clean dry and intact.  There are no signs of infection on exam.  Discussed with the patient that her soreness is most likely from the liposuction and should improve with time.  I recommended that she continue to adequately control her pain, and she may take the tramadol  if needed for pain.  I recommended that she try an incentive spirometer to see if that might help with her breathing, but I also instructed her to call her pulmonologist today to follow-up with them about her breathing.  I discussed with the patient that if her shortness of breath becomes worse she will need to go to the emergency room.  Patient expressed understanding.  Discussed with patient that she can apply lipo foam with no adhesive to it.  She states that she has some of these at home.  Recommended that she continue to drink plenty of water and make sure she is eating well.  Patient to follow back up next week.  I instructed her to call in the meantime she has any questions or concerns about anything.  Pictures were obtained of the patient and placed in the chart with the patient's or guardian's permission.

## 2023-09-11 ENCOUNTER — Telehealth: Payer: Self-pay

## 2023-09-11 NOTE — Telephone Encounter (Signed)
 Copied from CRM [id number]. Topic: General - Other >> Sep 08, 2023 10:48 AM Justina Oman C wrote: Reason for CRM: Patient [phone] had breast reduction and liposuction surgery on 08/30/23 and was having shortness of breath and went to ER 09/04/23 and the CT lung shows on of her lung showing the signs of collapse lungs and needs a incentive spirometer to exercise the lungs. Patient plastic surgeon Dr. True Fuss Dillingham/PA, Lamount Pimple will try to order incentive spirometer for patient, and wanted Dr. Linder Revere to know what's going on. FYI.   FYI to Roena Clark, NP since the appointment is tomorrow 09-12-23

## 2023-09-12 ENCOUNTER — Encounter: Payer: Self-pay | Admitting: Adult Health

## 2023-09-12 ENCOUNTER — Ambulatory Visit (INDEPENDENT_AMBULATORY_CARE_PROVIDER_SITE_OTHER): Admitting: Adult Health

## 2023-09-12 ENCOUNTER — Ambulatory Visit (INDEPENDENT_AMBULATORY_CARE_PROVIDER_SITE_OTHER)

## 2023-09-12 VITALS — BP 98/70 | HR 78 | Ht 63.5 in | Wt 177.8 lb

## 2023-09-12 DIAGNOSIS — G4733 Obstructive sleep apnea (adult) (pediatric): Secondary | ICD-10-CM | POA: Diagnosis not present

## 2023-09-12 DIAGNOSIS — J9811 Atelectasis: Secondary | ICD-10-CM | POA: Diagnosis not present

## 2023-09-12 DIAGNOSIS — J4530 Mild persistent asthma, uncomplicated: Secondary | ICD-10-CM | POA: Diagnosis not present

## 2023-09-12 DIAGNOSIS — J45909 Unspecified asthma, uncomplicated: Secondary | ICD-10-CM | POA: Insufficient documentation

## 2023-09-12 NOTE — Progress Notes (Signed)
 @Patient  ID: Mariah Vaughn, female    DOB: July 31, 1965, 58 y.o.   MRN: [id number]  Chief Complaint  Patient presents with   Hospitalization Follow-up    Referring provider: Mordechai April, DO  HPI: 58 year old female followed for asthma, allergic rhinitis and sleep apnea Medical history significant for bipolar disorder and anxiety  TEST/EVENTS :  HST 05/14/23- AHI 8.2/hr, desat to 85%, body weight 186 lbs   PFT 09/15/22- SPIROMETRY: normal spirometry Bronchodilator response: no significant response DLCO: within normal limits LUNG VOLUMES: within normal limits   09/12/2023 ER follow up  Patient returns for a follow-up from recent emergency room visit last week.  Patient underwent breast reduction surgery on August 30, 2023.  She says significant difficulty postop with nausea and vomiting.  She says she was given Zofran  and IV fluids.  Nausea and vomiting improved after discharge home but she developed increased shortness of breath and difficulty taking in a deep breath.  She felt that the breast binder made it hard to breathe and she started developing chest pain.  She subsequently went to the emergency room on September 04, 2023.  ER notes were reviewed.  CT chest angio was negative for PE.  Showed some bibasilar linear atelectasis.  Showed some pockets of fluid collection in the breast and chest wall.  She was recommended to get a incentive spirometer which she has been using for the last few days.  She was seen by plastic surgery on April 18 , note was reviewed and felt soreness was from liposuction and should improve with time..  Patient remains on Breo daily.  Denies any wheezing.  She says cough and sore throat have improved.  She has some nasal congestion and drainage.  She denies any hemoptysis, fever, orthopnea or edema.  Has mild sleep apnea.  Recommended to start on CPAP therapy.  She says that she is picking this up in 2 weeks.  Allergies  Allergen Reactions   Cefaclor Shortness Of  Breath, Itching and Rash   Codeine Nausea And Vomiting   Influenza Vaccines Shortness Of Breath   Oxycodone-Acetaminophen  Nausea And Vomiting   Penicillins Shortness Of Breath, Itching and Rash   Sulfa Antibiotics Anaphylaxis and Other (See Comments)   Cephalexin Itching   Flonase [Fluticasone  Propionate] Other (See Comments)    Headaches, nosebleed   Tamiflu [Oseltamivir Phosphate] Nausea Only and Other (See Comments)    STOMACH UPSET   Latex Rash     There is no immunization history on file for this patient.  Past Medical History:  Diagnosis Date   Allergic rhinitis    Anxiety    Asthma    Bipolar 2 disorder (HCC)    Bulging lumbar disc    C SPINE   Depression    Gastritis    GANEM   History of palpitations    HX OF PAC'S AND HX OF PVC'S   Hx of smoking    Knee pain, right    PFO (patent foramen ovale)    PONV (postoperative nausea and vomiting)    Sleep apnea     Tobacco History: Social History   Tobacco Use  Smoking Status Former   Current packs/day: 0.00   Average packs/day: 1.5 packs/day for 26.0 years (39.0 ttl pk-yrs)   Types: Cigarettes   Start date: 05/24/1975   Quit date: 05/23/2001   Years since quitting: 22.3  Smokeless Tobacco Never   Counseling given: Not Answered   Outpatient Medications Prior to Visit  Medication Sig  Dispense Refill   acetaminophen  (TYLENOL ) 500 MG tablet Take 1,000 mg by mouth every 6 (six) hours as needed for mild pain, fever or headache.     acyclovir (ZOVIRAX) 400 MG tablet Take 400 mg by mouth 3 (three) times daily as needed (For 10 days as needed for outbreak).     albuterol (PROVENTIL HFA;VENTOLIN HFA) 108 (90 Base) MCG/ACT inhaler Inhale 2 puffs into the lungs every 4 (four) hours as needed for wheezing or shortness of breath.     ARIPiprazole (ABILIFY) 5 MG tablet Take 2.5 mg by mouth daily.     BREO ELLIPTA  200-25 MCG/ACT AEPB TAKE 1 PUFF BY MOUTH EVERY DAY (Patient taking differently: Inhale 1 puff into the lungs  daily.) 60 each 2   citalopram (CELEXA) 10 MG tablet Take 2.5 mg by mouth daily.     fexofenadine (ALLEGRA) 180 MG tablet Take 180 mg by mouth daily as needed for allergies or rhinitis.     fluticasone  (FLONASE SENSIMIST ) 27.5 MCG/SPRAY nasal spray Place 1 spray into the nose daily as needed for rhinitis or allergies.     lithium carbonate (LITHOBID) 300 MG CR tablet Take 600 mg by mouth at bedtime.     LORazepam (ATIVAN) 0.5 MG tablet Take 0.25-0.5 mg by mouth every 6 (six) hours as needed for anxiety, seizure or sedation.     meclizine (ANTIVERT) 25 MG tablet Take 25 mg by mouth 2 (two) times daily as needed for dizziness or nausea.     methocarbamol (ROBAXIN) 750 MG tablet Take 750 mg by mouth every 6 (six) hours as needed for muscle spasms.     ondansetron  (ZOFRAN ) 4 MG tablet Take 1 tablet (4 mg total) by mouth every 8 (eight) hours as needed for up to 20 doses for nausea or vomiting. 20 tablet 0   traMADol  (ULTRAM ) 50 MG tablet Take 1 tablet (50 mg total) by mouth every 8 (eight) hours as needed for up to 20 doses for moderate pain (pain score 4-6) or severe pain (pain score 7-10). 20 tablet 0   estradiol (ESTRACE) 0.5 MG tablet Take 0.5 mg by mouth daily. (Patient not taking: Reported on 09/12/2023)     progesterone (PROMETRIUM) 100 MG capsule Take 100 mg by mouth daily. (Patient not taking: Reported on 09/12/2023)     No facility-administered medications prior to visit.     Review of Systems:   Constitutional:   No  weight loss, night sweats,  Fevers, chills, +fatigue, or  lassitude.  HEENT:   No headaches,  Difficulty swallowing,  Tooth/dental problems, or  Sore throat,                No sneezing, itching, ear ache, nasal congestion, post nasal drip,   CV:  No chest pain,  Orthopnea, PND, swelling in lower extremities, anasarca, dizziness, palpitations, syncope.   GI  No heartburn, indigestion, abdominal pain, nausea, vomiting, diarrhea, change in bowel habits, loss of appetite,  bloody stools.   Resp:   No chest wall deformity  Skin: no rash or lesions.  GU: no dysuria, change in color of urine, no urgency or frequency.  No flank pain, no hematuria   MS:  No joint pain or swelling.  No decreased range of motion.  No back pain.    Physical Exam  BP 98/70 (BP Location: Right Arm, Patient Position: Sitting, Cuff Size: Normal)   Pulse 78   Ht 5' 3.5" (1.613 m)   Wt 177 lb 12.8 oz (80.6 kg)  LMP  (LMP Unknown) Comment: no periods >35yrs  BMI 31.00 kg/m   GEN: A/Ox3; pleasant , NAD, well nourished    HEENT:  [place]/AT,   NOSE-clear, THROAT-clear, no lesions, no postnasal drip or exudate noted.   NECK:  Supple w/ fair ROM; no JVD; normal carotid impulses w/o bruits; no thyromegaly or nodules palpated; no lymphadenopathy.    RESP  Clear  P & A; w/o, wheezes/ rales/ or rhonchi. no accessory muscle use, no dullness to percussion  CARD:  RRR, no m/r/g, no peripheral edema, pulses intact, no cyanosis or clubbing.  GI:   Soft & nt; nml bowel sounds; no organomegaly or masses detected.   Musco: Warm bil, no deformities or joint swelling noted.   Neuro: alert, no focal deficits noted.    Skin: Warm, no lesions or rashes    Lab Results:  CBC    Component Value Date/Time   WBC 7.3 09/04/2023 1014   RBC 4.50 09/04/2023 1014   HGB 12.8 09/04/2023 1014   HCT 40.7 09/04/2023 1014   PLT 352 09/04/2023 1014   MCV 90.4 09/04/2023 1014   MCH 28.4 09/04/2023 1014   MCHC 31.4 09/04/2023 1014   RDW 13.1 09/04/2023 1014   LYMPHSABS 1.1 08/23/2010 0310   MONOABS 0.4 08/23/2010 0310   EOSABS 0.1 08/23/2010 0310   BASOSABS 0.0 08/23/2010 0310    BMET    Component Value Date/Time   NA 137 09/04/2023 1014   K 4.5 09/04/2023 1014   CL 104 09/04/2023 1014   CO2 23 09/04/2023 1014   GLUCOSE 115 (H) 09/04/2023 1014   BUN 11 09/04/2023 1014   CREATININE 0.66 09/04/2023 1014   CALCIUM 9.2 09/04/2023 1014   GFRNONAA >60 09/04/2023 1014   GFRAA  08/23/2010 0310     >60        The eGFR has been calculated using the MDRD equation. This calculation has not been validated in all clinical situations. eGFR's persistently <60 mL/min signify possible Chronic Kidney Disease.    BNP No results found for: "BNP"  ProBNP    Component Value Date/Time   PROBNP 24.0 10/07/2016 1556    Imaging: CT Angio Chest PE W/Cm &/Or Wo Cm Result Date: 09/04/2023 CLINICAL DATA:  Pulmonary embolism (PE) suspected, high prob. Status post breast reduction surgery on 08/30/2023. EXAM: CT ANGIOGRAPHY CHEST WITH CONTRAST TECHNIQUE: Multidetector CT imaging of the chest was performed using the standard protocol during bolus administration of intravenous contrast. Multiplanar CT image reconstructions and MIPs were obtained to evaluate the vascular anatomy. RADIATION DOSE REDUCTION: This exam was performed according to the departmental dose-optimization program which includes automated exposure control, adjustment of the mA and/or kV according to patient size and/or use of iterative reconstruction technique. CONTRAST:  75mL OMNIPAQUE  IOHEXOL  350 MG/ML SOLN, <See Chart> OMNIPAQUE  IOHEXOL  350 MG/ML SOLN COMPARISON:  04/29/2022. FINDINGS: Cardiovascular: Satisfactory opacification of the pulmonary arteries to the segmental level. No evidence of pulmonary embolism. Normal heart size. No pericardial effusion. Thoracic aorta is normal in caliber. Mediastinum/Nodes: No enlarged mediastinal, hilar, or axillary lymph nodes. Thyroid  gland, trachea, and esophagus demonstrate no significant findings. Lungs/Pleura: Mild bibasilar subsegmental linear atelectasis. No focal consolidation. No pleural effusion or pneumothorax. Upper Abdomen: No acute abnormality. Musculoskeletal: Status post breast reduction surgery with foci and pockets of soft tissue air tracking along the bilateral anterior chest wall (series 5, images 54-89). Soft tissue air tracking along the anterior margin of the left pectoralis  major muscle with a loculated collection with air-fluid  level in the subcutaneous fat of the left breast measuring approximately 2.2 x 2.0 x 2.0 cm (series 5, image 87 and series 13, image 22). There is ill-defined fluid tracking along the left lateral chest wall with additional smaller foci of soft tissue air. Similar less extensive soft tissue air tracking along the anterior margin of the right pectoralis major muscle with ill-defined fluid tracking along the right lateral chest wall an additional smaller foci of soft tissue air. Foci of soft tissue air are noted deep to the medial aspect of the right pectoralis major muscle. No discrete intramuscular collection identified. Review of the MIP images confirms the above findings. IMPRESSION: 1. No evidence of pulmonary embolism. 2. Status post breast reduction surgery with foci and pockets of soft tissue air tracking along the bilateral anterior chest wall, along the anterior margins of the right and left pectoralis major muscles. There is a 2.2 cm loculated collection with air-fluid level in the subcutaneous fat of the left breast, favored to represent a postsurgical collection of indeterminate sterility. Foci of soft tissue air are noted deep to the medial aspect of the right pectoralis major muscle. Ill-defined fluid tracking along the bilateral lateral chest walls with additional smaller foci of soft tissue air. No definite intramuscular collection identified. Electronically Signed   By: Mannie Seek M.D.   On: 09/04/2023 16:58   DG Chest 2 View Result Date: 09/04/2023 CLINICAL DATA:  Shortness of breath. History of recent breast reduction surgery 5 days ago. EXAM: CHEST - 2 VIEW COMPARISON:  Chest radiograph dated 06/09/2022. FINDINGS: The heart size and mediastinal contours are within normal limits. Mild left basilar atelectasis. No focal consolidation, pleural effusion, or pneumothorax. No acute osseous abnormality. IMPRESSION: Mild left basilar  atelectasis. Otherwise, no acute cardiopulmonary findings. Electronically Signed   By: Mannie Seek M.D.   On: 09/04/2023 11:18    Administration History     None          Latest Ref Rng & Units 09/15/2022    9:42 AM  PFT Results  FVC-Pre L 3.07   FVC-Predicted Pre % 90   FVC-Post L 3.16   FVC-Predicted Post % 93   Pre FEV1/FVC % % 78   Post FEV1/FCV % % 80   FEV1-Pre L 2.40   FEV1-Predicted Pre % 89   FEV1-Post L 2.52   DLCO uncorrected ml/min/mmHg 17.77   DLCO UNC% % 87   DLCO corrected ml/min/mmHg 17.77   DLCO COR %Predicted % 87   DLVA Predicted % 85   TLC L 5.24   TLC % Predicted % 105   RV % Predicted % 105     No results found for: "NITRICOXIDE"      Assessment & Plan:   No problem-specific Assessment & Plan notes found for this encounter.     Roena Clark, NP 09/12/2023

## 2023-09-12 NOTE — Assessment & Plan Note (Signed)
 Begin CPAP when available.  Plan  Patient Instructions  Continue on BREO 1 puff daily  Albuterol inhaler As needed   Incentive spirometry Three times a day   Activity as tolerated.  Begin CPAP when available, use each night all night long.  Follow up 2 months with Dr. Linder Revere  and As needed

## 2023-09-12 NOTE — Assessment & Plan Note (Signed)
 Appears controlled.  Continue on Breo. Plan  Patient Instructions  Continue on BREO 1 puff daily  Albuterol inhaler As needed   Incentive spirometry Three times a day   Activity as tolerated.  Begin CPAP when available, use each night all night long.  Follow up 2 months with Dr. Linder Revere  and As needed

## 2023-09-12 NOTE — Patient Instructions (Addendum)
 Continue on BREO 1 puff daily  Albuterol inhaler As needed   Incentive spirometry Three times a day   Activity as tolerated.  Begin CPAP when available, use each night all night long.  Follow up 2 months with Dr. Linder Revere  and As needed

## 2023-09-12 NOTE — Assessment & Plan Note (Signed)
 Bibasilar atelectasis on CT scan-recent surgery along with hypoventilation most likely.  Will follow-up chest x-ray today.  Continue incentive spirometer.  No signs of acute infection.Aaron Aas

## 2023-09-14 NOTE — Progress Notes (Signed)
 Patient is a 58 year old female who underwent bilateral breast reduction Dr. Orin Birk on 08/30/2023.  She is a little over 2 weeks postop.  Patient presents to the clinic today for postoperative follow-up.  Patient was last seen in the clinic on 09/08/2023.  At this visit, patient reported she was doing okay.  She stated that she was feeling somewhat short of breath.  She stated that she even went to the emergency room where they ruled out any sort of blood clot.  On exam, patient was sitting upright in no acute distress.  Breasts are soft and symmetric.  There is no overlying erythema.  There was some mild swelling noted bilaterally.  NAC's were healthy.  No obvious fluid collections palpated on exam.  Steri-Strips were in place over the incisions and were clean dry and intact.  Today, patient reports she is doing a little bit better than last week.  She states that her pain has been more controlled.  She states that she is taking the tramadol  a few times.  She does state though that she has had some low blood pressure readings since surgery.  Patient denies any fevers or chills.  She denies any drainage from either of her breast.  Patient does report that sometimes she is nauseous and has to take the Zofran .  She does state that she has not taken her hormone replacement therapy since before surgery and feels that can be affecting her.  Patient states that she saw her pulmonologist and has been using her incentive spirometer.  Vitals:   09/15/23 1408  BP: 119/82  Pulse: 83  SpO2: 97%  Vitals are stable today.  Blood pressure appears normal.  Chaperone present on exam.  On exam, patient is sitting upright in no acute distress.  Breasts are soft and symmetric.  There is no overlying erythema.  No obvious fluid collections are palpated on exam.  NAC's appear to be healthy.  Steri-Strips are in place and are clean dry and intact.  There are no signs of infection on exam.  Discussed with the patient that  her exam is reassuring.  Discussed with her to continue compression at all times and to closely monitor her breast.  Discussed with her to only take tramadol  if she needs it for her pain and to avoid taking it if she does not have to.  Patient expressed understanding.  Recommended that patient follow-up with her PCP in regards to her blood pressure.  Discussed with her that it is normal here today.  Patient expressed understanding.  Discussed with patient that now she is 2 weeks out from surgery, and she may start back on her hormone replacement therapy.  Discussed with her that the absence of her hormone replacement therapy could be causing some of her symptoms.  Patient expressed understanding, but states that she does not want to go back on it at this time.  Patient to follow back up next week.  I instructed her to call in the meantime if she has any questions or concerns about anything.

## 2023-09-15 ENCOUNTER — Ambulatory Visit: Admitting: Student

## 2023-09-15 ENCOUNTER — Encounter: Payer: Self-pay | Admitting: Student

## 2023-09-15 VITALS — BP 119/82 | HR 83

## 2023-09-15 DIAGNOSIS — N62 Hypertrophy of breast: Secondary | ICD-10-CM

## 2023-09-18 ENCOUNTER — Telehealth: Payer: Self-pay | Admitting: Plastic Surgery

## 2023-09-18 NOTE — Telephone Encounter (Signed)
 Patient said that the bandage under her Left breast was getting water under the tape and is eminating a bad smell and said when took a shower the bandage came loose and cut it to where it was sticky again and she was wandering if she should come in sooner than Friday, please reach out and advise

## 2023-09-18 NOTE — Telephone Encounter (Signed)
 Good Afternoon,  Patient may remove the mepilex border dressing (white dressing) and leave steri strips in place. If it is the steri strips she is talking about, she may remove those as well if she feels comfortable. If not, she can make an appointment for sooner than Friday to have them removed.   Margretta Shi, would you mind calling her and telling her?   Thank you, Anastacio Balm

## 2023-09-19 NOTE — Telephone Encounter (Signed)
 I called the patient and she said the mepilex border is off and the steri-strips are what she ended up trimming. I told her that was fine and she can put some gauze to keep it padded against her binder/sports bra for comfort. She feels comfortable keeping her appointment Friday and expressed understanding. I told her to call or send a message if anything changes.

## 2023-09-22 ENCOUNTER — Ambulatory Visit (INDEPENDENT_AMBULATORY_CARE_PROVIDER_SITE_OTHER): Admitting: Plastic Surgery

## 2023-09-22 ENCOUNTER — Encounter: Payer: Self-pay | Admitting: Plastic Surgery

## 2023-09-22 DIAGNOSIS — N62 Hypertrophy of breast: Secondary | ICD-10-CM

## 2023-09-22 NOTE — Progress Notes (Signed)
 The patient is a 58 year old female here for follow-up after undergoing bilateral breast reduction on April 9.  She had over 900 g removed from both breasts.  The breasts are healing nicely.  The Steri-Strips are still in place.  As they come loose she can trim them.  There is no sign of a hematoma or seroma.  No sign of infection.  She has good symmetry.  We plan to see her back in 2 weeks.  Continue with the sports bra and pad the inframammary fold as needed.  Pictures were obtained of the patient and placed in the chart with the patient's or guardian's permission.

## 2023-09-26 ENCOUNTER — Other Ambulatory Visit: Payer: Medicare Other

## 2023-10-03 ENCOUNTER — Ambulatory Visit: Admitting: Student

## 2023-10-03 ENCOUNTER — Encounter: Payer: Self-pay | Admitting: Student

## 2023-10-03 VITALS — BP 122/86 | HR 95

## 2023-10-03 DIAGNOSIS — N62 Hypertrophy of breast: Secondary | ICD-10-CM

## 2023-10-03 NOTE — Progress Notes (Signed)
 Patient is a 58 year old female who underwent bilateral breast reduction Dr. Orin Birk on 08/30/2023.  Patient is about 5 weeks postop.  She presents to the clinic today for postoperative follow-up.  Patient was last seen in the clinic on 09/22/2023.  At this visit, the breasts were healing nicely.  There is no sign of hematoma or seroma.  No sign of infection.  Patient had good symmetry.  Today, patient reports she is overall doing pretty well.  She states that she still has a little bit of soreness to the right lateral breast.  She also reports that she has some sutures protruding from her incisions.  She otherwise denies any new issues or concerns.  She denies any fevers.  Chaperone present on exam.  On exam, patient is sitting upright in no acute distress.  Breasts are overall soft and symmetric.  There is a little bit of firmness noted to just lateral of the left NAC.  There is no overlying skin changes.  The firmness feels consistent with scar tissue versus fat necrosis.  There were no fluid collections palpated on exam.  NAC's appear to be healthy bilaterally.  Steri-Strips from the incisions were removed.  Incisions appear to be intact and healing well.  There were several Monocryl sutures noted.  These were cut and removed.  Patient tolerated well.  There also appeared to be a red bump noted to the right inframammary incision consistent with a small suture abscess.  A small amount of cloudy drainage was expressed from the small bump.  There otherwise was no fluctuance, redness or further drainage.  I did discuss with the patient that it appeared she had a small suture abscess.  We discussed different options including close monitoring versus starting antibiotics for this small suture abscess.  Patient states that she thinks she had a reaction to the doxycycline  but she is not sure if it was the doxycycline  that caused the reaction.  Patient states that she also has allergies to penicillin.  Medical  decision making was made with the patient and decision was made to closely monitor the area for now.  Discussed with her that if she has any increased pain, redness or drainage she should let us  know.  Patient expressed understanding.  Discussed with the patient that starting next week, she may transition into a regular bra with no underwire and she may start gradually increasing her activities.  Patient expressed understanding.  Discussed with patient that I would like her to apply Vaseline to her incisions for now.  I also recommended that patient gently massage the area of firmness to her left breast 1-2 times daily.  Discussed with her to continue to monitor it.  We will have her follow back up in a few months to see if this is softening up.  We will plan to have the patient come back later this week so we can check on the suture abscess and see if it is improving.  I instructed the patient to call sooner if needed.

## 2023-10-04 ENCOUNTER — Other Ambulatory Visit: Payer: Self-pay | Admitting: Student

## 2023-10-04 ENCOUNTER — Telehealth: Payer: Self-pay | Admitting: Plastic Surgery

## 2023-10-04 MED ORDER — DOXYCYCLINE HYCLATE 100 MG PO TABS: 100.0000 mg | ORAL_TABLET | Freq: Two times a day (BID) | ORAL | 0 refills | Status: AC

## 2023-10-04 NOTE — Telephone Encounter (Signed)
 She does have an appt Tuesday with Lamount Pimple .Aaron AasAaron AasRight nipple is really red, she's not sure if she needs any antibiotics, she states that she allergic to penicillin and caphlax (she says that you keep offering it), please reach out and advise

## 2023-10-04 NOTE — Progress Notes (Signed)
 Patient called today stating that she had some redness around her nipple.  She denies any drainage.  She denies any fevers.  She does state her breasts are little bit sore.  Discussed with patient that we can start her on an antibiotic.  We discussed doxycycline .  Patient states she has taken this in the past and done well.  She states that when she had a rash after surgery, she thought it may have been from anesthesia or the Doxy, she was not sure.  I discussed with her that we can start the Doxy, but if she starts to have a rash, she is to stop it immediately.  Patient expressed understanding was in agreement with this plan.  Discussed with the patient that I would like to see her back tomorrow.

## 2023-10-05 ENCOUNTER — Ambulatory Visit: Admitting: Student

## 2023-10-05 VITALS — BP 125/86 | HR 93 | Temp 97.5°F

## 2023-10-05 DIAGNOSIS — N62 Hypertrophy of breast: Secondary | ICD-10-CM

## 2023-10-05 NOTE — Progress Notes (Signed)
 Patient is a 58 year old female who underwent bilateral breast reduction Dr. Orin Birk on 08/30/2023.  Patient is about 5 weeks postop. She presents to the clinic today with concerns about redness to her nipple.     Patient was last seen in the clinic on 10/03/2023.  At this visit, patient reported she is overall doing well, bu reports she is overall doing well, but did report some soreness to her right lateral breast.  On exam, breasts are overall soft and symmetric.  There is a little bit of firmness noted just to lateral of the left NAC.  NAC's were healthy bilaterally.  Incisions were intact and healing well.  There was a red bump noted to the right inframammary incision consistent with a small suture abscess.  There was a small amount of cloudy drainage expressed from the bump.  There is otherwise no fluctuance or further drainage.  We discussed possibility of starting antibiotics, but decided to continue to monitor closely.  Patient then called yesterday stating that she had some redness around her nipple.  Doxycycline  was started for her.  Today, patient reports she is doing well.  She states that since her appointment on Tuesday, she has noticed some redness around her nipples, more so on the right than the left.  She states that she has started the doxycycline , has not had issues with that since starting it.  She also states that she feels the redness has improved since yesterday.  She denies any fevers.  She denies any drainage from either of her breast.  Vitals:   10/05/23 1408  BP: 125/86  Pulse: 93  Temp: (!) 97.5 F (36.4 C)  SpO2: 97%   Vitals are stable.  Patient is afebrile.  Chaperone present on exam.  On exam, patient is sitting upright in no acute distress.  Breasts are soft and fairly symmetric.  There is some overlying erythema noted to the medial breast bilaterally, more so to the right breast than the left.  It does not appear to be cellulitic.  It is nontender to palpation.   There are no obvious fluid collections on exam.  There is still a little bit of firmness just lateral of the left NAC, consistent with scar tissue versus fat necrosis.  NAC's appear to be healthy bilaterally.  Incisions are intact and healing well.  Small lump to the right inframammary incision has improved significantly.  It is not erythematous.  It is not draining.  Discussed with patient that this could be early cellulitis versus some discoloration to the inferior breasts.  I recommended that she continue to take the doxycycline .  I did discuss with her though that if she does have a reaction to it, she is to stop it immediately.  And patient understands that if she develops any shortness of breath or difficulty breathing she needs to go to the emergency room.  Patient expressed understanding.  Recommended that patient continue to closely monitor her breasts and that if she has any worsening redness, pain, fevers, or any worsening or concerning symptoms she needs to call us .  I discussed with her to have a low threshold to call us .  Patient expressed understanding.  Recommended that patient apply Vaseline to her incisions daily.  Also recommended that she gently massage her incisions as well as her left breast.  Patient expressed understanding.  Patient to follow back up next week on Tuesday for reevaluation.  I instructed her to call in the meantime she has any questions or  concerns about anything.  Pictures were obtained of the patient and placed in the chart with the patient's or guardian's permission.

## 2023-10-10 ENCOUNTER — Ambulatory Visit (INDEPENDENT_AMBULATORY_CARE_PROVIDER_SITE_OTHER): Admitting: Student

## 2023-10-10 DIAGNOSIS — N62 Hypertrophy of breast: Secondary | ICD-10-CM

## 2023-10-10 NOTE — Progress Notes (Signed)
 Patient is a 58 year old female who underwent bilateral breast reduction Dr. Orin Birk on 08/30/2023.  Patient is about 6 weeks postop.  She presents to the clinic today for postoperative follow-up.       Patient was last seen in the clinic on 10/05/2023.  At this visit, patient was doing well.  She had noticed some redness around her NAC and doxycycline  was started for her.  On exam, breasts were soft and symmetric.  There were no fluid collections on exam.  There was a little bit of erythema noted to the breasts bilaterally, more so on the right than the left.  The small bump to the right inframammary incision had improved significantly.  Recommended that patient continue to take her doxycycline  and follow-up.  Today, patient reports she is doing okay.  She states that the redness is still present to her medial breast, but has not worsened.  She states that she still has some discomfort when she feels like her breasts are not fully supported.  She denies any fevers.  She does reports she has a few sutures that are bothering her.  Patient also states that she has been doing okay with the doxycycline  and has not had any issues with it since starting it.  Chaperone present on exam.  On exam, patient is sitting upright in no acute distress.  Breasts are overall soft and symmetric.  There is still a small area of firmness noted to the left breast just lateral of the NAC, slightly improved from previous exam.  I do believe this is consistent with fat necrosis or scar tissue.  There is still some mild erythema noted to the breasts bilaterally medially, fairly similar to previous exam.  It does not appear to be cellulitic.  There is no significant tenderness to palpation.  No obvious fluid collections on exam.  No crepitus noted.  NAC's are healthy bilaterally.  Incisions are intact and healing well.  There were a few sutures protruding from the skin that were cut and removed.  Discussed with the patient that her  redness may just be a little bit of discoloration.  I did discuss with her though that I would like her to continue to closely monitor it and that if it becomes worse, painful, or if she develops any fevers that she should let us  know.  Patient expressed understanding.  Discussed with patient that she may start transitioning to scar creams if she would like.  Also discussed with patient that she may transition into a regular bra with no underwire and start increasing her activities.  Patient expressed understanding.  I would like to see the patient back in 1 to 2 weeks.  I instructed her to call in the meantime she has any questions or concerns about anything.

## 2023-10-12 ENCOUNTER — Ambulatory Visit: Admitting: Internal Medicine

## 2023-10-17 ENCOUNTER — Ambulatory Visit (INDEPENDENT_AMBULATORY_CARE_PROVIDER_SITE_OTHER): Admitting: Student

## 2023-10-17 DIAGNOSIS — N62 Hypertrophy of breast: Secondary | ICD-10-CM

## 2023-10-17 NOTE — Progress Notes (Signed)
 Patient is a 58 year old female who underwent bilateral breast reduction Dr. Orin Birk on 08/30/2023.  Patient is about 7 weeks postop.  She presents to the clinic today for postoperative follow-up.  Patient was last seen in the clinic on 10/10/2023.  At this visit, patient reported she is doing okay.  On exam, breasts were overall soft and symmetric.  There was some firmness noted just lateral of the left NAC, and appeared to be consistent with fat necrosis or scar tissue.  There is still some mild erythema noted at the breast bilaterally medially, similar to the previous exams.  NAC's were healthy.  Today, patient reports she is doing well.  She states that she still has a little bit of soreness to her breasts bilaterally.  She states otherwise there is still little bit of redness present to her breasts, she suspects that this is from her sports bra.  She does not report the redness getting any worse.  She denies any fevers.  She otherwise reports she is doing well.  Chaperone present on exam.  On exam, patient is sitting upright in no acute distress.  Breasts are overall soft and symmetric.  There is still firmness noted just lateral of the left NAC and a little bit of firmness noted to lateral right breast.  I suspect that this is scar tissue versus may be's possibly some fat necrosis.  There is still some mild erythema noted medially bilaterally, it appears similar to previous exam.  It does not appear to be cellulitic.  NAC's appear to be healthy.  There does appear to be a little bit of irritation surrounding both of the NAC's.  Incisions are clean dry and intact and healing well.  There are no fluid collections palpated on exam.  Discussed with patient that the erythema to her breasts appears to be stable.  Discussed with her to continue to massage the areas of firmness to her breasts.  Also discussed with her that she may start scar creams to her incisions.  Patient expressed  understanding.  Discussed with patient that soreness should improve with time.  I did discuss with her that if she has any worsening soreness she should let us  know.  Patient expressed understanding.  We will plan to see the patient back in about 3 months to reevaluate the areas of firmness.  Instructed patient to call in the meantime she has any questions or concerns about anything.  Pictures were obtained of the patient and placed in the chart with the patient's or guardian's permission.

## 2023-10-24 ENCOUNTER — Telehealth: Payer: Self-pay | Admitting: Plastic Surgery

## 2023-10-24 NOTE — Telephone Encounter (Signed)
 Patient said that breast center is wanting to know when she should have an ultrasound done and how far out, they were thinking 12 weeks but needs confirmation and a reason why, they are requesting a referral to have it done, please reach out and advise. Breast imaging in our building

## 2023-10-27 ENCOUNTER — Telehealth: Payer: Self-pay | Admitting: Student

## 2023-10-27 NOTE — Telephone Encounter (Signed)
 I called the patient as she had called earlier this week about getting an ultrasound.  She states that the breast center called her stating that she was due for her regular mammogram.  She states that they recommended she undergo an ultrasound given that she recently just had a breast reduction surgery.  I discussed with the patient that she is able to get a mammogram at around 6 months postop.  She states that she does not want to wait until then and would like to move forward with the ultrasound.  I discussed with her that that is okay from our standpoint.  Patient also states that she has noticed a little bit of redness around her nipples bilaterally.  She denies any fevers or increased pain.  She states that she feels it is from the regular bra that she transition to.  She states that when she wears her compression bra she does not notice it.  She reports that she is going to try to cover the nipples while wearing the bra.  I discussed with her that she should closely monitor the area, if she feels it is worsening she should let us  know.  I did told her that if she is developing any fevers, worsening redness, or worsening pain she should let us  know.  Patient expressed understanding.

## 2023-10-27 NOTE — Telephone Encounter (Signed)
 ADDEND: Called and spoke with the patient again after speaking with breast center.  Breast center reported that they were fine with patient waiting for her mammogram 6 months postop.  I called and discussed this with the patient.  I discussed with her that I would recommend she wait a few more months to further investigate the firmness to her left breast, which felt consistent with fat necrosis or scar tissue, to see if it softens up.  I recommended that she continue to massage.  I discussed with the patient that if it is still present in a few months, we can go ahead and order an ultrasound to further investigate it.  Patient expressed understanding was in agreement with this.  I called the patient to call me though in the meantime if she has any further questions or concerns about anything.

## 2023-11-01 ENCOUNTER — Telehealth: Payer: Self-pay

## 2023-11-01 DIAGNOSIS — G4733 Obstructive sleep apnea (adult) (pediatric): Secondary | ICD-10-CM

## 2023-11-01 NOTE — Telephone Encounter (Signed)
 Order- DME- please change autopap range to 4-12

## 2023-11-01 NOTE — Telephone Encounter (Signed)
 Copied from CRM [id number]. Topic: General - Other >> Nov 01, 2023 12:37 PM Alverda Joe S wrote: Reason for CRM: patient is calling to ask doctor to lower her pressure on cpap, [facility] explained it may help her. Please call patient and do  Spoke with patient regarding prior message . Patient stated she has been coughing ,congested and her lungs have been hurting. Patent would like for her CPAP pressure to be lowered. Patient has got a new mask as well to try that to see if that will help.  Advised patient I will send Dr.Young a message regarding this with a print out of patient's compliance . Dr.Young can you please advise   Thank you

## 2023-11-01 NOTE — Telephone Encounter (Signed)
 Left message on VM.  Will send in order to change pressure settings per Dr. Linder Revere. Orders sent this encounter.

## 2023-11-17 ENCOUNTER — Telehealth: Payer: Self-pay

## 2023-11-17 DIAGNOSIS — G4733 Obstructive sleep apnea (adult) (pediatric): Secondary | ICD-10-CM

## 2023-11-17 NOTE — Telephone Encounter (Signed)
 Copied from CRM [id number]. Topic: Clinical - Medical Advice >> Nov 16, 2023  1:12 PM Celestine FALCON wrote: Reason for CRM: Pt sees Dr. Neysa and has a CPAP machine. Pt is on her 3rd different CPAP mask due to uncomfortable fits as well as leaking. Pt stated she is only getting 2-3 hours of sleep at night due to this. Pt stated she is going to be getting a new face mask that sits below her nose, but she wanted Dr. Neysa to know that she is really struggling with using the CPAP machine. Pt's phone number is [phone].

## 2023-11-20 ENCOUNTER — Telehealth: Payer: Self-pay

## 2023-11-20 NOTE — Telephone Encounter (Signed)
 Copied from CRM [id number]. Topic: Clinical - Medical Advice >> Nov 20, 2023 11:59 AM Devaughn RAMAN wrote: Reason for CRM: Patient called regarding her CPAP machine. Patient stated Saturday and Sunday she had an issue wearing her CPAP machine. Patient stated the pressure starts out at 4 and ramps up at 10 min a minute or so, patient states she would like to know if the modem can be looked at regarding the pressure, patient stated she will be wearing it again tonight but she would like to know if the pressure can be looked at or the modem. Patient would like a callback regarding. Patient was thankful and verbalized understanding.    Lm to return call.   ( If patient calls back please advise her to call DME to fix the issue).   NFN

## 2023-11-20 NOTE — Telephone Encounter (Signed)
 Called patient.  Patient states her CPAP starts out at 4 cm and within 5 minutes it is at 10 cm pressure.  Patient states she is unable to wear the new mask due to the high pressure.  Patient has reached out to [facility] twice regarding the pressure setting being too high but has not gotten the problem resolved.  Patient would like Dr. Neysa to place an order for [facility] to trouble shoot her CPAP machine and/or adjust the pressure to a lower setting.  Dr. Neysa, please advise.

## 2023-11-20 NOTE — Telephone Encounter (Signed)
 Left message on VM informing patient we placed order with [facility] to set CPAP at fixed pressure 5 and service machine if needed. Order placed this encounter.

## 2023-11-20 NOTE — Telephone Encounter (Signed)
 Please ask DME to set CPAP at fixed pressure 5 and service machine if needed.

## 2023-11-20 NOTE — Addendum Note (Signed)
 Addended by: Lynsey Ange M on: 11/20/2023 04:31 PM   Modules accepted: Orders

## 2023-11-20 NOTE — Telephone Encounter (Signed)
 Hopefully she will get a chance to try this new mask again before her appointment with me in July.

## 2023-11-30 ENCOUNTER — Telehealth: Payer: Self-pay

## 2023-11-30 NOTE — Telephone Encounter (Signed)
 Dr. Neysa, I received this email today from Thomasina Colorado with [employer]:  Re:   Gladis Setter DOB: 1965-11-20 Setup Date:??  10/13/2023 Ordering provider:?  Young, [place]      I have been assigned a pressure change for this patient. Unfortunately, I am unable to complete the pressure change for the following reason:      ____Device has not been powered on.   ____Unable to reach patient after 3 attempts.    X Patient Refuses Pressure Change.  Patient states she is feeling pretty good on her current settings of APAP 4 -12 cmH2O.  She does have some awakenings. She states she has a follow up with the provider on 12/05/2023 and would like to discuss her download and options at that time with him. If he continues to want a pressure change at that time, she is happy to follow his instruction.  (Current 95 percentile pressure on APAP 4 -12 cmH2O is 8.7 cmH2O)  Please advise the provider the pressure change could not be completed.      Thank you!    FYI:  It looks like the patient contact us  several times in June 2025 wanting pressure adjustments.  Patient does have an office visit with you on 12/05/2023.  Patient can address this with you at that time.

## 2023-12-03 NOTE — Progress Notes (Signed)
 HPI F followed for OSA, complicated by Asthma, Allergic Rhinitis, DDD, Bipolar, Anxiety/ Depression PFT 09/15/22- SPIROMETRY: normal spirometry Bronchodilator response: no significant response DLCO: within normal limits LUNG VOLUMES: within normal limits HST 05/14/23- AHI 8.2/hr, desat to 85%, body weight 186 lbs ================================================================================================   07/31/23- 57 yoF followed for OSA, complicated by Asthma, Allergic Rhinitis, DDD, Bipolar, Anxiety/ Depression PFT 09/15/22- SPIROMETRY: normal spirometry Bronchodilator response: no significant response DLCO: within normal limits LUNG VOLUMES: within normal limits HST 05/14/23- AHI 8.2/hr, desat to 85%, body weight 186 lbs Body weight today-182 lbs For treatment decision -----SOB persistent.  Having consult with plastic surgeon regarding breast reduction surgery to help with breathing problems Sleeps alone. Complains of difficulty initiating and maintaining sleep with several wakings during night. Daytime tiredness. We discussed her mild OSA and treatment options focused on symptoms. After detailed discussion, she decided to try CPAP to see if it could relieve the daytime sleepiness. She had tended to spit out previous mouthpieces. 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  12/05/23- 7 yoF followed for OSA, complicated by Asthma, Allergic Rhinitis, DDD, Bipolar, Anxiety/ Depression PFT 09/15/22- SPIROMETRY: normal spirometry Bronchodilator response: no significant response DLCO: within normal limits LUNG VOLUMES: within normal limits HST 05/14/23- AHI 8.2/hr, desat to 85%, body weight 186 lbs CXR 09/12/23- IMPRESSION: No active cardiopulmonary disease.  Had breast reduction surgery in April LOV here for hosp f/u, was to pick up CPAP in 2 weeks at that time. CPAP auto 4-12cwp/ [facility] April, 2025 Repeated complaints pressure too high.  Download compliance-40%, AHI  2.1/hr     mostly short nights Body weight today-175 lbs Doing better now after breast reduction, binders off, and likes hybrid mask. Has new psychiatrist for Bipolar. Helping with short sleep pattern- melotonin or seroquel. I suggested with leave it to that provider to manage insomnia. Keep working on weight. Asking to see if we can get coverage for Zepbound for OSA/ obesity. Careful discussion incl. GI and thyroid / endocrine issues. Agreed to try low dose. Discussed the use of AI scribe software for clinical note transcription with the patient, who gave verbal consent to proceed.  History of Present Illness   Mariah Vaughn is a 58 year old female with sleep apnea who presents for follow-up regarding her CPAP therapy.  She experiences improved breathing with a new full face CPAP mask that sits under her nose, providing more comfort and reducing leaks. She uses the mask for close to four hours most nights. Her CPAP machine settings are stable, ranging between four and twelve, with most time spent around nine, resolving previous issues of fluctuating pressure.  Her sleep is often disrupted, typically sleeping two to three hours before waking. She sometimes makes decaf coffee before returning to sleep. She aims to use the CPAP for at least four hours a night, sometimes wearing it while reading or watching TV to meet this goal.  She experiences dry mouth and occasional headaches, likely due to mouth breathing and airflow from the CPAP machine. No chest pain, shortness of breath, or palpitations are present.   She asks us  to try prescribing Zepbound for weight loss to treat her obstructive sleep apnea. Previously on Wegovy. We discussed risks/ potential issues, potential for GI, thyroid  cancer.     Assessment and Plan:    Obstructive Sleep apnea Sleep apnea managed with CPAP. Improved breathing with new mask and adjusted pressure settings. Experiences two breakthrough apneas per hour. Dry mouth  and occasional  headaches noted, possibly due to airflow. - Continue CPAP settings with pressure range 4-12. - Encourage CPAP use for at least 4 hours per night on 6 out of 7 nights. - Discuss with home care company if adjustments needed. - Consider increasing humidifier setting if dryness persists.  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 Improved breathing reported. - Continue monitoring with follow-up appointments.  Bipolar disorder- managed elsewhere Bipolar disorder affects sleep patterns. Transitioning to psychiatrist Mariah Vaughn. Considering melatonin before medications like Seroquel due to sedation. - Discuss sleep management with psychiatrist Mariah Vaughn. - Consider starting melatonin for sleep. - Use Seroquel if unable to sleep for 2-3 days.   Obesity- complicating management of OSA - Cautious trial Zepbound with full ddiscussion, questions answered       ROS-see HPI   + = positive Constitutional:    weight loss, night sweats, fevers, chills, fatigue, lassitude. HEENT:    headaches, difficulty swallowing, tooth/dental problems, sore throat,       sneezing, itching, ear ache, nasal congestion, post nasal drip, snoring CV:    chest pain, orthopnea, PND, swelling in lower extremities, anasarca,                                   dizziness, palpitations Resp:   shortness of breath with exertion or at rest.                productive cough,   non-productive cough, coughing up of blood.              change in color of mucus.  wheezing.   Skin:    rash or lesions. GI:  No-   heartburn, indigestion, abdominal pain, nausea, vomiting, diarrhea,                 change in bowel habits, loss of appetite GU: dysuria, change in color of urine, no urgency or frequency.   flank pain. MS:   joint pain, stiffness, decreased range of motion, back pain. Neuro-     nothing unusual Psych:  change in mood or affect.  depression  or anxiety.   memory loss.  OBJ- Physical Exam General- Alert, Oriented, Affect-appropriate, Distress- none acute, overweight Skin- rash-none, lesions- none, excoriation- none Lymphadenopathy- none Head- atraumatic            Eyes- Gross vision intact, PERRLA, conjunctivae and secretions clear            Ears- Hearing, canals-normal            Nose- Clear, no-Septal dev, mucus, polyps, erosion, perforation             Throat- Mallampati II , mucosa clear , drainage- none, tonsils- atrophic Neck- flexible , trachea midline, no stridor , thyroid  nl, carotid no bruit Chest - symmetrical excursion , unlabored           Heart/CV- RRR , no murmur , no gallop  , no rub, nl s1 s2                           - JVD- none , edema- none, stasis changes- none, varices- none           Lung- clear to P&A, wheeze- none, cough- none , dullness-none, rub- none           Chest wall-  Abd-  Br/ Gen/ Rectal- Not done, not indicated Extrem- cyanosis- none, clubbing, none, atrophy- none, strength- nl Neuro- grossly intact to observation

## 2023-12-05 ENCOUNTER — Encounter: Payer: Self-pay | Admitting: Internal Medicine

## 2023-12-05 ENCOUNTER — Ambulatory Visit (INDEPENDENT_AMBULATORY_CARE_PROVIDER_SITE_OTHER): Admitting: Internal Medicine

## 2023-12-05 VITALS — BP 129/83 | HR 82 | Ht 63.0 in | Wt 175.0 lb

## 2023-12-05 DIAGNOSIS — G4733 Obstructive sleep apnea (adult) (pediatric): Secondary | ICD-10-CM | POA: Diagnosis not present

## 2023-12-05 NOTE — Patient Instructions (Addendum)
 We can continue CPAP auto 4-12.  Keep working on your weight. Let us  know if we can help.

## 2024-01-19 ENCOUNTER — Encounter: Payer: Self-pay | Admitting: Student

## 2024-01-19 ENCOUNTER — Ambulatory Visit: Admitting: Student

## 2024-01-19 VITALS — BP 125/83 | HR 98 | Ht 63.5 in | Wt 174.4 lb

## 2024-01-19 DIAGNOSIS — N62 Hypertrophy of breast: Secondary | ICD-10-CM | POA: Diagnosis not present

## 2024-01-19 NOTE — Progress Notes (Signed)
   Referring Provider Dayna Motto, DO [address] [place],  [place] [address]   CC:  Chief Complaint  Patient presents with   Follow-up      Mariah Vaughn is an 58 y.o. female.  HPI: Patient is a 58 year old female who underwent bilateral breast reduction Dr. Lowery on 08/30/2023.  She is almost 5 months out from her surgery and she presents to the clinic today for further postoperative follow-up.  Patient was last seen in the clinic on 10/17/2023.  At this visit, patient was doing well.  On exam, breasts were overall soft and symmetric, there was still little bit of firmness noted  just lateral of the left NAC and a little bit of firmness noted to the lateral right breast.  NAC's were healthy.  Plan is for patient to follow back up in 3 months.  Today, patient reports she is overall doing well.  She states that she still has some irritation around her nipples.  She reports she has been alternating between Mederma and CeraVe lotion.  She reports that the irritation mainly happens whenever she is wearing a bra.  She states it goes away after she takes her bra off.  She denies using any new soaps or detergents.  She states that she has tried several different types of bras and the same issue seems to happen.  She denies any other issues or concerns at this time.  Review of Systems General: Does not report any fevers or chills  Physical Exam    01/19/2024   10:25 AM 12/05/2023    3:39 PM 10/05/2023    2:08 PM  Vitals with BMI  Height 5' 3.5 5' 3   Weight 174 lbs 6 oz 175 lbs   BMI 30.41 31.01   Systolic 125 129 874  Diastolic 83 83 86  Pulse 98 82 93    General:  No acute distress,  Alert and oriented, Non-Toxic, Normal speech and affect Chaperone present on exam.  On exam, patient is sitting upright in no acute distress.  Breasts are soft and overall symmetric.  The firmness noted out to the lateral breast bilaterally seems to have softened up significantly.  There is a  little bit of irritation noted to the superior aspects of the NAC's bilaterally.  NAC's appear to be viable.  There is otherwise no overlying erythema or obvious fluid collections noted on exam.  There are no signs of infection.  All the other incisions are well-healed.  Assessment/Plan Symptomatic mammary hypertrophy   I recommended that the patient clean the areas of irritation with Vashe to see if this might help.  I discussed with her that she can also try holding off on the Mederma to see if she is having some sort of reaction to it.  She expressed understanding.  I recommended that she continue to massage the areas of firmness out laterally.  I also encouraged her to keep her mammogram appointment.  She expressed understanding.  Patient to follow back up in 3 months for reevaluation per her request.  I instructed her to call in the meantime she has any questions or concerns about anything.  Pictures were obtained of the patient and placed in the chart with the patient's or guardian's permission.  Mariah Vaughn 01/19/2024, 11:04 AM

## 2024-02-02 ENCOUNTER — Other Ambulatory Visit (HOSPITAL_BASED_OUTPATIENT_CLINIC_OR_DEPARTMENT_OTHER): Payer: Self-pay | Admitting: Family Medicine

## 2024-02-02 DIAGNOSIS — H547 Unspecified visual loss: Secondary | ICD-10-CM

## 2024-02-02 DIAGNOSIS — R519 Headache, unspecified: Secondary | ICD-10-CM

## 2024-02-04 ENCOUNTER — Ambulatory Visit (HOSPITAL_BASED_OUTPATIENT_CLINIC_OR_DEPARTMENT_OTHER)
Admission: RE | Admit: 2024-02-04 | Discharge: 2024-02-04 | Disposition: A | Discharge status: Home / Self Care | Source: Ambulatory Visit | Attending: Family Medicine | Admitting: Family Medicine

## 2024-02-04 ENCOUNTER — Encounter (HOSPITAL_BASED_OUTPATIENT_CLINIC_OR_DEPARTMENT_OTHER): Payer: Self-pay

## 2024-02-04 DIAGNOSIS — R519 Headache, unspecified: Secondary | ICD-10-CM

## 2024-02-04 DIAGNOSIS — H547 Unspecified visual loss: Secondary | ICD-10-CM

## 2024-02-05 ENCOUNTER — Encounter (HOSPITAL_BASED_OUTPATIENT_CLINIC_OR_DEPARTMENT_OTHER): Payer: Self-pay

## 2024-02-05 ENCOUNTER — Ambulatory Visit (HOSPITAL_BASED_OUTPATIENT_CLINIC_OR_DEPARTMENT_OTHER)
Admission: RE | Admit: 2024-02-05 | Discharge: 2024-02-05 | Disposition: A | Discharge status: Home / Self Care | Source: Ambulatory Visit | Attending: Family Medicine | Admitting: Family Medicine

## 2024-02-05 ENCOUNTER — Telehealth: Payer: Self-pay | Admitting: Family Medicine

## 2024-02-05 DIAGNOSIS — H547 Unspecified visual loss: Secondary | ICD-10-CM | POA: Diagnosis present

## 2024-02-05 DIAGNOSIS — R519 Headache, unspecified: Secondary | ICD-10-CM | POA: Diagnosis present

## 2024-02-05 MED ORDER — IOHEXOL 300 MG/ML  SOLN
100.0000 mL | Freq: Once | INTRAMUSCULAR | Status: AC | PRN
  Administered 2024-02-05: 75 mL via INTRAVENOUS

## 2024-02-05 NOTE — Telephone Encounter (Signed)
 Copied from CRM [id number]. Topic: Clinical - Medical Advice >> Feb 05, 2024  1:57 PM Nathanel DEL wrote: Reason for CRM: pt states she has had sinus infection for a couple of weeks, and has not been able to use her cpap as directed, only 1 or so hrs a night. Pt got a new mask and is wondering if you think that may be adding to the sinus infection.  She says she has not gotten used to the mask yet. Would like Dr Saundra nurse give her call.  She said she can probably explain better her needs.

## 2024-02-05 NOTE — Telephone Encounter (Signed)
 FYI Only or Action Required?: Action required by provider: clinical question for provider.  Patient was last seen in Pulmonary on 12/05/2023 with Dr. Neysa Fortis Nurse Triage reporting Advice Only.  Symptoms began several weeks ago.  Interventions attempted: Nothing.  Symptoms are: unchanged.  Triage Disposition: PCP Call   Patient/caregiver understands and will follow disposition?: Yes   **See note below**   Patient reports her mask was changed x 3 weeks ago, and was wondering if the mask is causing her sinus infection to persist. She can only use her CPAP for a couple hours during the night, she would like to discuss further with provider. She would like to discuss her CPAP settings as well with provider.                Copied from CRM [id number]. Topic: Clinical - Medical Advice >> Feb 05, 2024  1:57 PM Nathanel DEL wrote: Reason for CRM: pt states she has had sinus infection for a couple of weeks, and has not been able to use her cpap as directed, only 1 or so hrs a night. Pt got a new mask and is wondering if you think that may be adding to the sinus infection.  She says she has not gotten used to the mask yet. Would like Dr Saundra nurse give her call.  She said she can probably explain better her needs.

## 2024-02-11 ENCOUNTER — Other Ambulatory Visit: Payer: Self-pay | Admitting: Internal Medicine

## 2024-02-11 DIAGNOSIS — J4540 Moderate persistent asthma, uncomplicated: Secondary | ICD-10-CM

## 2024-02-12 ENCOUNTER — Other Ambulatory Visit: Payer: Self-pay | Admitting: *Deleted

## 2024-02-12 DIAGNOSIS — G4733 Obstructive sleep apnea (adult) (pediatric): Secondary | ICD-10-CM

## 2024-02-12 NOTE — Telephone Encounter (Signed)
 I called and spoke with patient, I provided the information per Dr. Neysa.  She states she has never tried the oral appliance for sleep apnea.  I advised I would make Dr. Neysa aware.  I let her know that we would make changes in her settings and they would be affective within the next 24 hours.  She was saying that she is staying dry, she is a mouth breathing, she has a full face mask.  I advised her to work with the humidity on the machine and if she needed assistance to call [employer] to help her trouble shoot.  She verbalized understanding.  Dr. Neysa, The patient states she has never tried an oral appliance for sleep apnea.  I do not see a referral for oral appliance.  Thank you.

## 2024-02-12 NOTE — Telephone Encounter (Signed)
 Called and spoke with patient, she had been wearing an F30 mask, she was told they were not making that mask anymore and was given an F40 medium mask.  She says this mask actually fits better than the F30 mask.  She had a sinus infection, but it is cleared up now.  She was having head/chest congestion and a headache.  The pressure went from 4-12 cm H20 and it was waking her up, she felt the pressure was too high.  She Dr. Neysa had discussed changing the pressure to a set pressure of 5 cm H20, however she feels the 4 cm H20 is too much at times.  She has not been able to wear the CPAP machine for the past 4 days and the headache is now gone.  She is sleeping again.  She is wondering what the next step is in the process.  She said she has never been comfortable wearing the CPAP and the pressure has always been a problem.  Recently, she had only been getting about an hour of sleep.  I advised her I would print the DL for Dr. Neysa to review and once he provides recommendations, we will call her back and let her know.  She verbalized understanding.  Dr. Neysa,  Please advise. Thank you.

## 2024-02-12 NOTE — Telephone Encounter (Signed)
 She has very mild sleep apnea. She previously failed to tolerate oral appliance and has not been comfortable with CPAP. Suggest: Order DME to change CPAP pressure to auto 4-8 cwp.

## 2024-02-16 ENCOUNTER — Ambulatory Visit: Payer: Self-pay | Admitting: Internal Medicine

## 2024-02-16 NOTE — Telephone Encounter (Signed)
 Spoke with patient. She denied appointment as she stated she doesnt have dry mouth/sinuses or a headache until she is using the machine. She also stated the humidity is not being enabled on her machine. When she spoke with her supplying company, they told her to contact us . Please advise.

## 2024-02-16 NOTE — Telephone Encounter (Signed)
 Please advise on C-PAP settings.

## 2024-02-16 NOTE — Telephone Encounter (Signed)
 FYI Only or Action Required?: Action required by provider: clinical question for provider.  Patient is followed in Pulmonology for Asthma and sleep apnea, last seen on 12/05/2023 by Neysa Reggy BIRCH, MD.  Called Nurse Triage reporting CPAP Problem.  Symptoms began several weeks ago.  Symptoms are: gradually worsening.  Triage Disposition: Call PCP Now  Patient/caregiver understands and will follow disposition?: Yes       Copied from CRM #[id number]. Topic: Clinical - Red Word Triage >> Feb 16, 2024 10:08 AM Leila C wrote: Red Word that prompted transfer to Nurse Triage: Patient [phone] states has a cpap machine and has not been able to use it for about two weeks with the mask and adjustment. Patient states they changed her cpap settings- 4 to 12, dropped it to 4-8 and bumped up the humidity. Patient contact [employer] [facility] but was advised to contact Dr. Saundra office for the cpap machine RPM settings. Patient is getting congestion from using it, wheezing, migraine/sinus headaches, dry mouth and nose. Patient is unsure if her asthma is acting up or it's the cpap machine. Please advise.       Reason for Disposition  Nursing judgment or information in reference  Answer Assessment - Initial Assessment Questions Patient would like a call back to discuss her CPAP settings. Please advise.      1. REASON FOR CALL: What is your main concern right now?     Problem for her CPAP 2. ONSET: When did the CPAP problems start?     2 weeks ago  4. FUNCTIONAL IMPAIRMENT: How have things been going for you overall? Have you had more difficulty than usual doing your normal daily activities? (e.g., self-care, school, work, interactions)     No 6. FEVER: Do you have a fever?     No 7. OTHER SYMPTOMS: Do you have any other new symptoms?     Dry mouth, headache, congestion in the morning  Protocols used: No Guideline Available-A-AH

## 2024-02-16 NOTE — Telephone Encounter (Signed)
 Pt notified and she will schedule. Can you help with scheduling?

## 2024-02-16 NOTE — Telephone Encounter (Signed)
 I suggest she be seen by one of our providers. More likely asthma than a CPAP problem, but hard to tell without exam.

## 2024-02-19 NOTE — Telephone Encounter (Signed)
 ATC x1.  LVM to return call regarding recommendations per Dr. Neysa to be seen in the office as he believes this may be an asthma problem and not a CPAP problem, hard to tell,  but needs exam.  Mychart message sent as well.

## 2024-02-26 ENCOUNTER — Ambulatory Visit (INDEPENDENT_AMBULATORY_CARE_PROVIDER_SITE_OTHER): Admitting: Adult Health

## 2024-02-26 ENCOUNTER — Encounter: Payer: Self-pay | Admitting: Adult Health

## 2024-02-26 VITALS — BP 116/74 | HR 102 | Temp 98.3°F | Ht 63.0 in | Wt 179.2 lb

## 2024-02-26 DIAGNOSIS — G4733 Obstructive sleep apnea (adult) (pediatric): Secondary | ICD-10-CM | POA: Diagnosis not present

## 2024-02-26 DIAGNOSIS — J4540 Moderate persistent asthma, uncomplicated: Secondary | ICD-10-CM

## 2024-02-26 DIAGNOSIS — J45909 Unspecified asthma, uncomplicated: Secondary | ICD-10-CM

## 2024-02-26 DIAGNOSIS — J309 Allergic rhinitis, unspecified: Secondary | ICD-10-CM

## 2024-02-26 MED ORDER — ALBUTEROL SULFATE HFA 108 (90 BASE) MCG/ACT IN AERS: 1.0000 | INHALATION_SPRAY | RESPIRATORY_TRACT | 5 refills | Status: DC | PRN

## 2024-02-26 MED ORDER — FLUTICASONE FUROATE-VILANTEROL 200-25 MCG/ACT IN AEPB: 1.0000 | INHALATION_SPRAY | Freq: Every day | RESPIRATORY_TRACT | 11 refills | Status: DC

## 2024-02-26 NOTE — Patient Instructions (Signed)
 Continue on BREO 1 puff daily, rinse after use.  Albuterol inhaler As needed    Continue on CPAP At bedtime, use each night all night long.  Work on healthy weight loss  Do not drive if sleepy  Saline nasal Twice daily   Saline nasal gel At bedtime   Refer to Orthodontics for oral appliance  Follow up in 4-6 weeks and As needed

## 2024-02-26 NOTE — Progress Notes (Signed)
 @Patient  ID: Mariah Vaughn, female    DOB: Nov 13, 1965, 58 y.o.   MRN: [id number]  Chief Complaint  Patient presents with   Follow-up    OSA, asthma    Referring provider: Dayna Motto, DO  HPI: 58 year old female followed for asthma, allergic rhinitis and sleep apnea Medical history significant for bipolar disorder and anxiety   TEST/EVENTS :  HST 05/14/23- AHI 8.2/hr, desat to 85%, body weight 186 lbs   PFT 09/15/22- SPIROMETRY: normal spirometry Bronchodilator response: no significant response DLCO: within normal limits LUNG VOLUMES: within normal limits    Discussed the use of AI scribe software for clinical note transcription with the patient, who gave verbal consent to proceed.  History of Present Illness Mariah Vaughn is a 58 year old female with mild sleep apnea who presents with issues related to CPAP use.  She has been using a CPAP machine for the past five months for her mild sleep apnea. Significant issues have arisen, including a severe sinus infection during CPAP use, which led to a CT scan and antibiotic treatment. She discontinued CPAP use three weeks ago and completed a course of clindamycin, which she took three times a day due to severe diarrhea when attempting the prescribed four times a day. A concurrent tooth infection occurred during this period.  Persistent chest congestion occurs every morning while using the CPAP. She has pets and is concerned about allergens being drawn into the CPAP machine, potentially contributing to her infections. Despite using distilled water and attempting regular cleaning, issues with the humidity settings not enabling properly have been noted. She recently switched from an F30 to an F40 mask due to availability issues.  She has a history of asthma and uses Breo, which causes coughing and gagging due to its dry powder form. She is also on lithium, which contributes to dry mouth.   She remains very active plays  volleyball.       Allergies  Allergen Reactions   Cefaclor Shortness Of Breath, Itching and Rash   Codeine Nausea And Vomiting   Influenza Vaccines Shortness Of Breath   Oxycodone-Acetaminophen  Nausea And Vomiting   Penicillins Shortness Of Breath, Itching and Rash   Sulfa Antibiotics Anaphylaxis and Other (See Comments)   Cephalexin Itching   Flonase [Fluticasone  Propionate] Other (See Comments)    Headaches, nosebleed   Tamiflu [Oseltamivir Phosphate] Nausea Only and Other (See Comments)    STOMACH UPSET   Latex Rash     There is no immunization history on file for this patient.  Past Medical History:  Diagnosis Date   Allergic rhinitis    Anxiety    Asthma    Bipolar 2 disorder (HCC)    Bulging lumbar disc    C SPINE   Depression    Gastritis    GANEM   History of palpitations    HX OF PAC'S AND HX OF PVC'S   Hx of smoking    Knee pain, right    PFO (patent foramen ovale)    PONV (postoperative nausea and vomiting)    Sleep apnea     Tobacco History: Social History   Tobacco Use  Smoking Status Former   Current packs/day: 0.00   Average packs/day: 1.5 packs/day for 26.0 years (39.0 ttl pk-yrs)   Types: Cigarettes   Start date: 05/24/1975   Quit date: 05/23/2001   Years since quitting: 22.7  Smokeless Tobacco Never   Counseling given: Not Answered   Outpatient Medications Prior to Visit  Medication Sig Dispense Refill   acetaminophen  (TYLENOL ) 500 MG tablet Take 1,000 mg by mouth every 6 (six) hours as needed for mild pain, fever or headache.     acyclovir (ZOVIRAX) 400 MG tablet Take 400 mg by mouth 3 (three) times daily as needed (For 10 days as needed for outbreak).     albuterol (PROVENTIL HFA;VENTOLIN HFA) 108 (90 Base) MCG/ACT inhaler Inhale 2 puffs into the lungs every 4 (four) hours as needed for wheezing or shortness of breath.     ARIPiprazole (ABILIFY) 5 MG tablet Take 2.5 mg by mouth daily.     citalopram (CELEXA) 10 MG tablet Take 2.5 mg  by mouth daily.     fexofenadine (ALLEGRA) 180 MG tablet Take 180 mg by mouth daily as needed for allergies or rhinitis.     fluticasone  (FLONASE SENSIMIST ) 27.5 MCG/SPRAY nasal spray Place 1 spray into the nose daily as needed for rhinitis or allergies.     fluticasone  furoate-vilanterol (BREO ELLIPTA ) 200-25 MCG/ACT AEPB Inhale 1 puff into the lungs daily. 3 each 1   lithium carbonate (LITHOBID) 300 MG CR tablet Take 600 mg by mouth at bedtime.     LORazepam (ATIVAN) 0.5 MG tablet Take 0.25-0.5 mg by mouth every 6 (six) hours as needed for anxiety, seizure or sedation.     ondansetron  (ZOFRAN ) 4 MG tablet Take 1 tablet (4 mg total) by mouth every 8 (eight) hours as needed for up to 20 doses for nausea or vomiting. 20 tablet 0   meclizine (ANTIVERT) 25 MG tablet Take 25 mg by mouth 2 (two) times daily as needed for dizziness or nausea. (Patient not taking: Reported on 02/26/2024)     methocarbamol (ROBAXIN) 750 MG tablet Take 750 mg by mouth every 6 (six) hours as needed for muscle spasms. (Patient not taking: Reported on 02/26/2024)     traMADol  (ULTRAM ) 50 MG tablet Take 1 tablet (50 mg total) by mouth every 8 (eight) hours as needed for up to 20 doses for moderate pain (pain score 4-6) or severe pain (pain score 7-10). (Patient not taking: Reported on 02/26/2024) 20 tablet 0   No facility-administered medications prior to visit.     Review of Systems:   Constitutional:   No  weight loss, night sweats,  Fevers, chills, fatigue, or  lassitude.  HEENT:   No headaches,  Difficulty swallowing,  Tooth/dental problems, or  Sore throat,                No sneezing, itching, ear ache, +nasal congestion, post nasal drip,   CV:  No chest pain,  Orthopnea, PND, swelling in lower extremities, anasarca, dizziness, palpitations, syncope.   GI  No heartburn, indigestion, abdominal pain, nausea, vomiting, diarrhea, change in bowel habits, loss of appetite, bloody stools.   Resp: No shortness of breath with  exertion or at rest.  No excess mucus, no productive cough,  No non-productive cough,  No coughing up of blood.  No change in color of mucus.  No wheezing.  No chest wall deformity  Skin: no rash or lesions.  GU: no dysuria, change in color of urine, no urgency or frequency.  No flank pain, no hematuria   MS:  No joint pain or swelling.  No decreased range of motion.  No back pain.    Physical Exam  BP 116/74   Pulse (!) 102   Temp 98.3 F (36.8 C) (Oral)   Ht 5' 3 (1.6 m)   Wt 179 lb  3.2 oz (81.3 kg)   LMP  (LMP Unknown) Comment: no periods >59yrs  SpO2 97% Comment: on  RA  BMI 31.74 kg/m   GEN: A/Ox3; pleasant , NAD, well nourished    HEENT:  /AT,   NOSE-clear, THROAT-clear, no lesions, no postnasal drip or exudate noted.   NECK:  Supple w/ fair ROM; no JVD; normal carotid impulses w/o bruits; no thyromegaly or nodules palpated; no lymphadenopathy.    RESP  Clear  P & A; w/o, wheezes/ rales/ or rhonchi. no accessory muscle use, no dullness to percussion  CARD:  RRR, no m/r/g, no peripheral edema, pulses intact, no cyanosis or clubbing.  GI:   Soft & nt; nml bowel sounds; no organomegaly or masses detected.   Musco: Warm bil, no deformities or joint swelling noted.   Neuro: alert, no focal deficits noted.    Skin: Warm, no lesions or rashes    Lab Results:    BNP No results found for: BNP  ProBNP    Component Value Date/Time   PROBNP 24.0 10/07/2016 1556    Imaging: CT HEAD W & WO CONTRAST ( ) Result Date: 02/05/2024 EXAM: CT HEAD WITHOUT AND WITH 02/05/2024 11:02:11 AM TECHNIQUE: CT of the head was performed with and without the administration of 75 mL of intravenous iohexol  (OMNIPAQUE ) 300 mg/mL solution. Automated exposure control, iterative reconstruction, and/or weight based adjustment of the mA/kV was utilized to reduce the radiation dose to as low as reasonably achievable. COMPARISON: MRI of the head dated 03/10/2014. CLINICAL HISTORY: 3 weeks  of headache, vision loss in left eye. FINDINGS: BRAIN AND VENTRICLES: No acute intracranial hemorrhage. No mass effect or midline shift. No extra-axial fluid collection. No evidence of acute infarct. No hydrocephalus. ORBITS: No acute abnormality. SINUSES AND MASTOIDS: No acute abnormality. SOFT TISSUES AND SKULL: No acute skull fracture. No acute soft tissue abnormality. IMPRESSION: 1. No acute intracranial abnormality. Electronically signed by: Evalene Coho MD 02/05/2024 11:23 AM EDT RP Workstation: [id number]    Administration History     None          Latest Ref Rng & Units 09/15/2022    9:42 AM  PFT Results  FVC-Pre L 3.07   FVC-Predicted Pre % 90   FVC-Post L 3.16   FVC-Predicted Post % 93   Pre FEV1/FVC % % 78   Post FEV1/FCV % % 80   FEV1-Pre L 2.40   FEV1-Predicted Pre % 89   FEV1-Post L 2.52   DLCO uncorrected ml/min/mmHg 17.77   DLCO UNC% % 87   DLCO corrected ml/min/mmHg 17.77   DLCO COR %Predicted % 87   DLVA Predicted % 85   TLC L 5.24   TLC % Predicted % 105   RV % Predicted % 105     No results found for: NITRICOXIDE      Assessment & Plan:   Assessment and Plan Assessment & Plan Obstructive sleep apnea  -mild obstructive sleep apnea She experiences obstructive sleep apnea with intolerance to CPAP therapy due to sinus infection and dry mouth.  Sinus and tooth infections resolved after antibiotic treatment. She is interested in a dental appliance as an alternative to CPAP. Potential benefits and drawbacks, including jaw soreness, TMJ issues, and insurance coverage challenges, were discussed. She is considering additional CPAP filters to prevent infections. Refer to an orthodontist for evaluation of a dental appliance for sleep apnea. Advise on CPAP machine maintenance: use distilled water, change water daily, and clean mask and tubing regularly. Adjust CPAP  settings to auto humidity and temperature. Encourage a trial of additional CPAP filters. Regroup  in 4-6 weeks to assess tolerance and effectiveness of current management.  Asthma   Asthma is managed with Breo Ellipta . She reports coughing and gagging with inhalation, likely due to excessive inhalation force. She uses albuterol as needed, especially during physical activities like volleyball. Continue Breo Ellipta  with instruction to inhale slowly to avoid gag reflex. Rinse well after use.  Refill albuterol inhaler for use as needed.  Allergic rhinitis -continue on Flonase and Allergra As needed    Plan  Patient Instructions  Continue on BREO 1 puff daily, rinse after use.  Albuterol inhaler As needed    Continue on CPAP At bedtime, use each night all night long.  Work on healthy weight loss  Do not drive if sleepy  Saline nasal Twice daily   Saline nasal gel At bedtime   Refer to Orthodontics for oral appliance  Follow up in 4-6 weeks and As needed               Madelin Stank, NP 02/26/2024

## 2024-02-29 NOTE — Telephone Encounter (Signed)
 If she is still having trouble getting comfortable le with CPAP, then suggest we order Referral to Dr Mrk Katx, Orthodontist- consider oral appliance for OSA.

## 2024-03-04 NOTE — Telephone Encounter (Signed)
 Called patient,referral placed on 10/6,patient states is using her cpap better and been using it more often and is becoming more comfortable,will let us  know if something changes but will continue with cpap

## 2024-03-05 ENCOUNTER — Ambulatory Visit: Admitting: Internal Medicine

## 2024-03-15 IMAGING — MG MM DIGITAL SCREENING BILAT W/ TOMO AND CAD
8 series · 8 of 24 positions shown · non-contrast
Comparison: Previous exam(s).

CLINICAL DATA: Screening.

EXAM:
DIGITAL SCREENING BILATERAL MAMMOGRAM WITH TOMOSYNTHESIS AND CAD
TECHNIQUE: Bilateral screening digital craniocaudal and mediolateral oblique
mammograms were obtained. Bilateral screening digital breast
tomosynthesis was performed. The images were evaluated with
computer-aided detection.

[R MLO synth-2D]
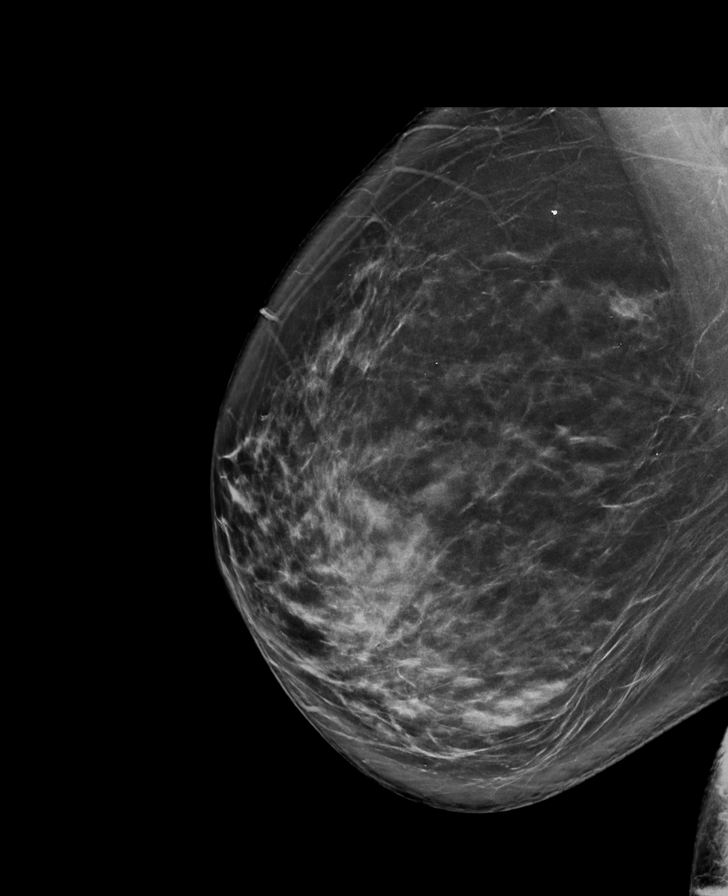

[R CC synth-2D]
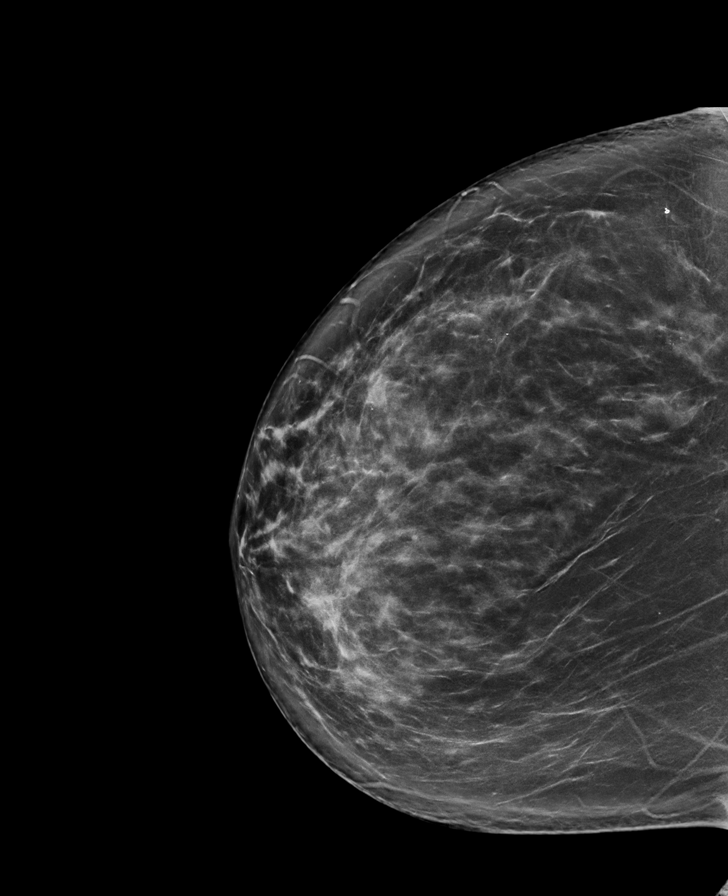

[L MLO synth-2D]
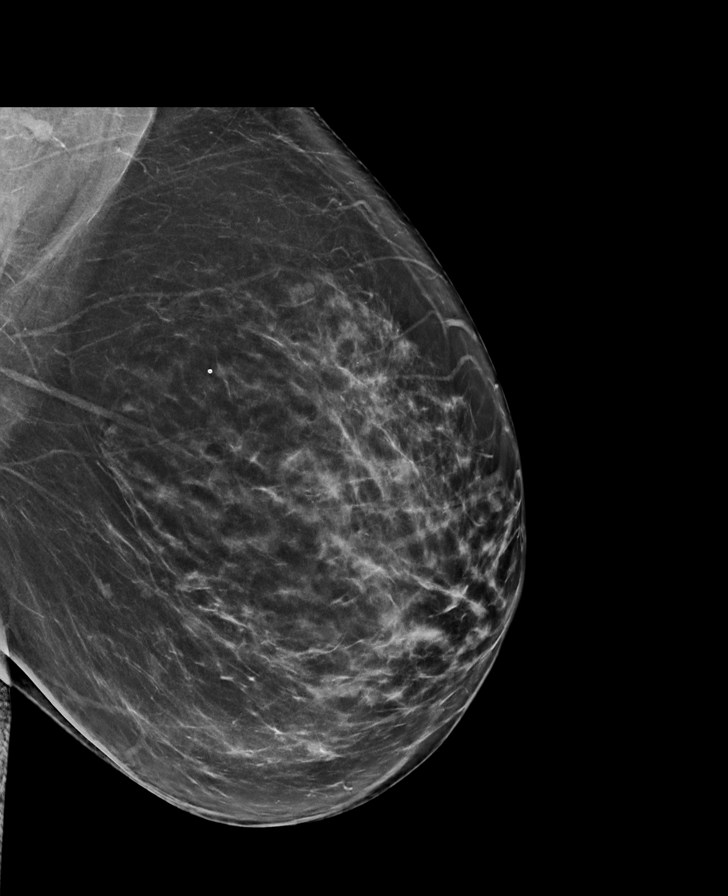

[L CC synth-2D]
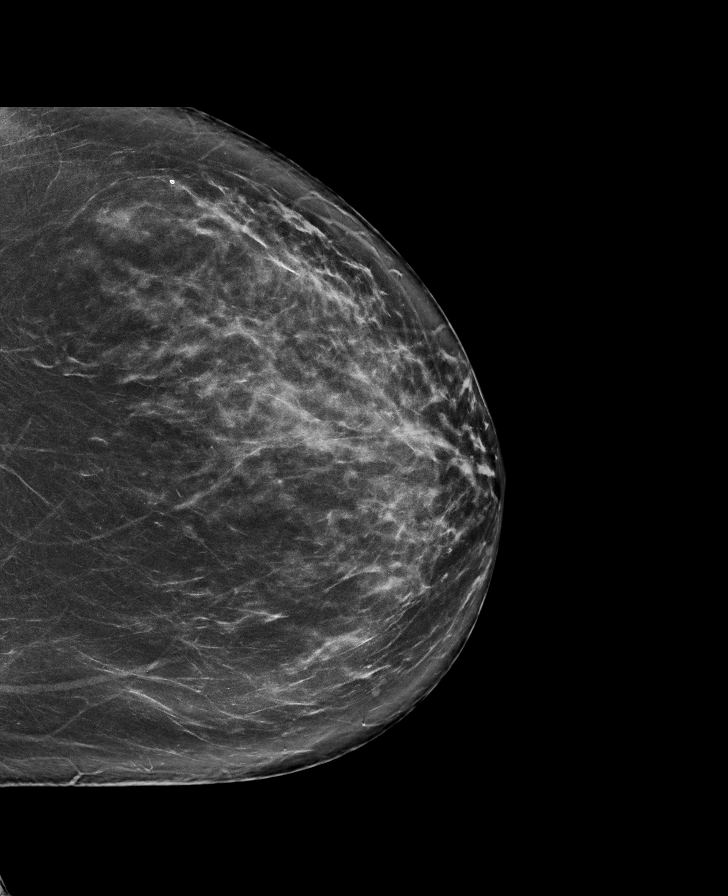

[R MLO tomo · tomo slice 49/96.0]
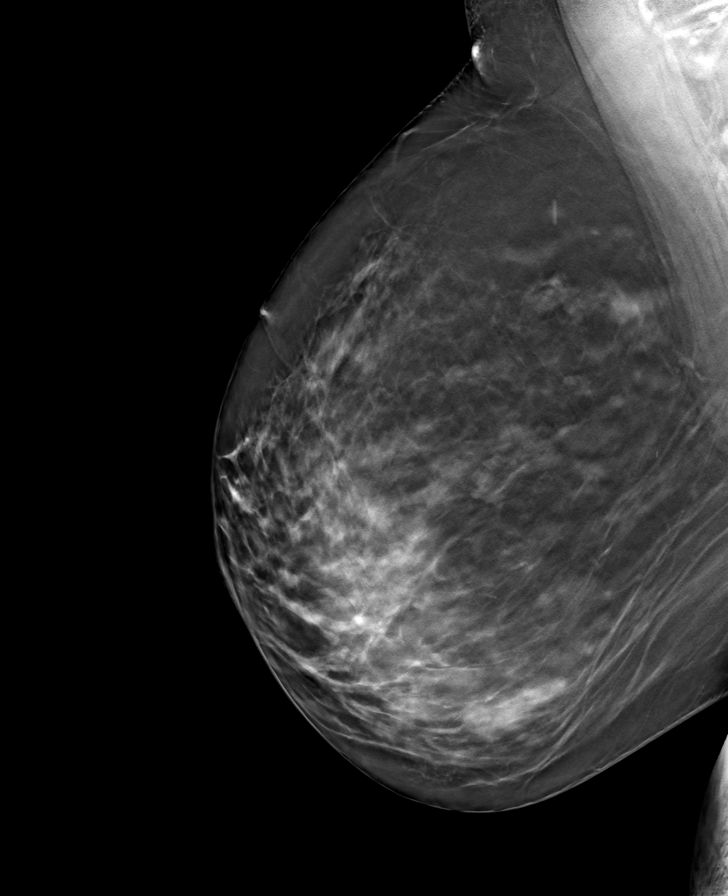

[L CC tomo · tomo slice 45/89.0]
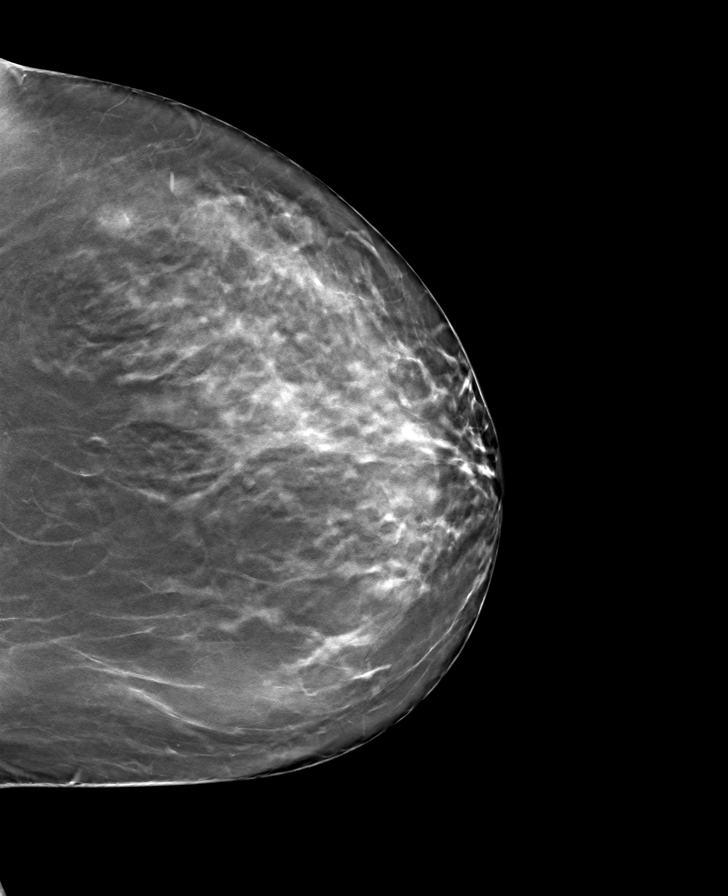

[R CC tomo · tomo slice 49/96.0]
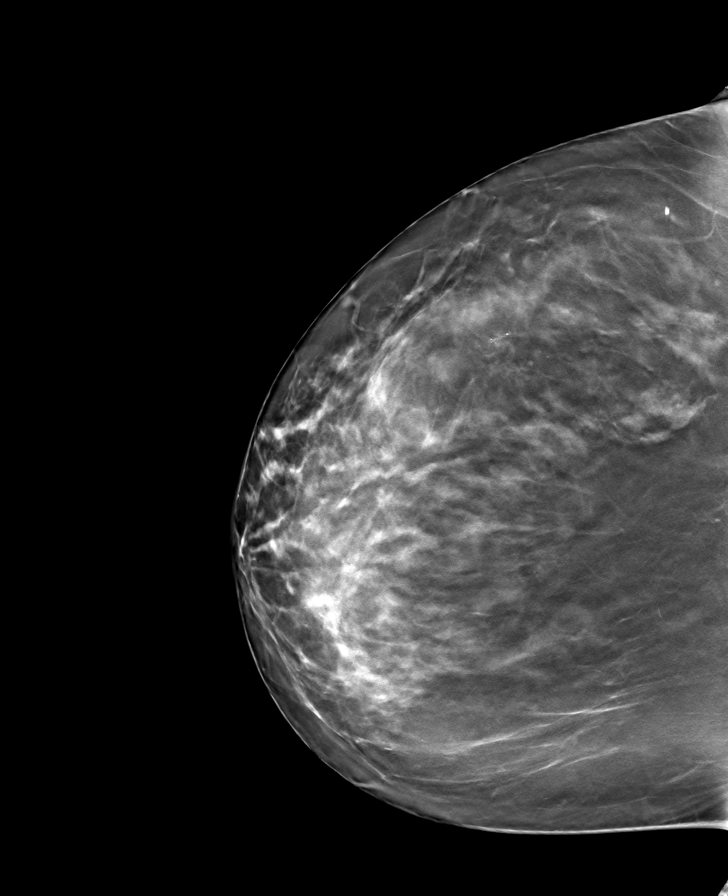

[L MLO tomo · tomo slice 45/89.0]
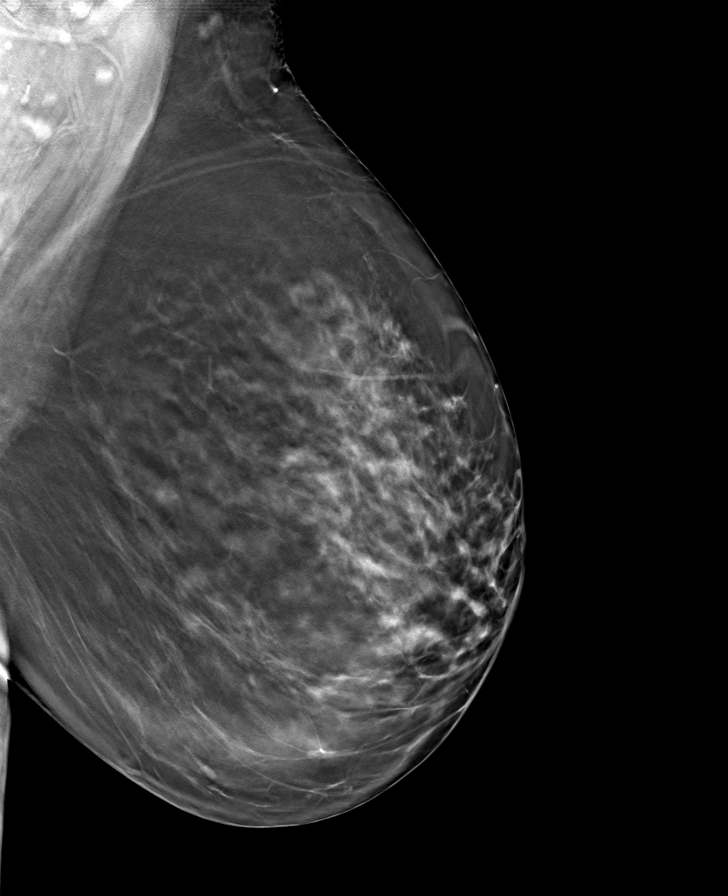

[8 of 24 positions shown; findings below may reference images not displayed]

ACR Breast Density Category c: The breast tissue is heterogeneously
dense, which may obscure small masses.
FINDINGS: There are no findings suspicious for malignancy.
IMPRESSION: No mammographic evidence of malignancy. A result letter of this
screening mammogram will be mailed directly to the patient.

RECOMMENDATION:
Screening mammogram in one year. (Code:Q3-[id number])

BI-RADS CATEGORY  1: Negative.

## 2024-04-05 ENCOUNTER — Encounter: Payer: Self-pay | Admitting: Adult Health

## 2024-04-05 ENCOUNTER — Ambulatory Visit: Admitting: Adult Health

## 2024-04-05 VITALS — BP 112/70 | HR 94 | Ht 63.0 in | Wt 180.8 lb

## 2024-04-05 DIAGNOSIS — J4540 Moderate persistent asthma, uncomplicated: Secondary | ICD-10-CM | POA: Diagnosis not present

## 2024-04-05 DIAGNOSIS — R0609 Other forms of dyspnea: Secondary | ICD-10-CM

## 2024-04-05 DIAGNOSIS — G4733 Obstructive sleep apnea (adult) (pediatric): Secondary | ICD-10-CM | POA: Diagnosis not present

## 2024-04-05 DIAGNOSIS — J309 Allergic rhinitis, unspecified: Secondary | ICD-10-CM

## 2024-04-05 MED ORDER — ALBUTEROL SULFATE HFA 108 (90 BASE) MCG/ACT IN AERS: 1.0000 | INHALATION_SPRAY | RESPIRATORY_TRACT | 5 refills | Status: AC | PRN

## 2024-04-05 MED ORDER — FLUTICASONE FUROATE-VILANTEROL 200-25 MCG/ACT IN AEPB: 1.0000 | INHALATION_SPRAY | Freq: Every day | RESPIRATORY_TRACT | 11 refills | Status: AC

## 2024-04-05 NOTE — Patient Instructions (Addendum)
 Continue on BREO 1 puff daily, rinse after use.  Albuterol inhaler As needed    Continue on CPAP At bedtime, use each night all night long.  Work on healthy weight loss  Do not drive if sleepy  Saline nasal Twice daily   Saline nasal gel At bedtime  -AYR Refer to Orthodontics for oral appliance (Dr [phone])  Follow up in  6 months and As needed  -Anthem Frazer NP or Neda MD

## 2024-04-05 NOTE — Progress Notes (Signed)
 @Patient  ID: Mariah Vaughn, female    DOB: Mar 01, 1966, 58 y.o.   MRN: [id number]  Chief Complaint  Patient presents with   Follow-up    OSA and asthma f/u    Referring provider: Dayna Motto, DO  HPI: 58 year old female former smoker followed for asthma, allergic rhinitis and obstructive sleep apnea Medical history significant for bipolar disorder and anxiety    TEST/EVENTS : Reviewed 04/05/2024  HST 05/14/23- AHI 8.2/hr, desat to 85%, body weight 186 lbs    PFT 09/15/22- SPIROMETRY: normal spirometry Bronchodilator response: no significant response DLCO: within normal limits LUNG VOLUMES: within normal limits    Discussed the use of AI scribe software for clinical note transcription with the patient, who gave verbal consent to proceed.  History of Present Illness Mariah Vaughn is a 58 year old female with sleep apnea who presents for a follow-up regarding CPAP use.   Patient says overall she is doing better on CPAP.  Is trying to use it most every night and feels that she is starting to tolerate it better.   She has added an extra filter to the machine, which she believes helps, especially since she has pets that sleep with her. She cleans the filter weekly and sometimes twice a week, and she empties the water daily. Despite these efforts, she experiences dryness in her nose and mouth, which she attributes to the CPAP use.  She continues to take it off and on sometimes throughout the night but overall feels that it is working.  CPAP download shows excellent compliance with daily average usage at 4 hours.  She is on auto CPAP 4-8 cm H2O.  AHI 1.0/hour.  She is using a fullface mask.  [employer] is the DME. She uses a nasal mask (F40)    She has a history of nasal surgery and nasal polyps. She uses a saline nasal spray and is interested in trying a saline nasal gel to alleviate nasal dryness. She has tried a CPAP pillow to improve her sleep quality, which elevates her neck and may  help with her sleep apnea.  She is menopausal and has bipolar disorder, which affects her sleep. She takes lithium at night, which is supposed to make her sleepy, but she finds it does not. She sometimes sits with the CPAP on for about 20 minutes to help her relax and fall asleep. She is currently averaging four hours of CPAP use per night and we discussed  working towards increasing this to six hours.  Patient says overall asthma is doing well.  She denies any flare of cough or wheezing.  Does get short of breath with heavy activities.   She has a history of breast reduction surgery, which helped with her breathing, but she still experiences significant shortness of breath during physical activity.   She smoked for 30 years, starting at age 63 and quitting at age 72 She uses Breo and a rescue inhaler, and she rinses her mouth after using the inhaler.  CT chest September 04, 2023 negative for PE, mild bibasilar atelectasis.  Postsurgical changes from breast reduction.    Allergies  Allergen Reactions   Cefaclor Shortness Of Breath, Itching and Rash   Codeine Nausea And Vomiting   Influenza Vaccines Shortness Of Breath   Oxycodone-Acetaminophen  Nausea And Vomiting   Penicillins Shortness Of Breath, Itching and Rash   Sulfa Antibiotics Anaphylaxis and Other (See Comments)   Cephalexin Itching   Flonase [Fluticasone  Propionate] Other (See Comments)  Headaches, nosebleed   Tamiflu [Oseltamivir Phosphate] Nausea Only and Other (See Comments)    STOMACH UPSET   Latex Rash     There is no immunization history on file for this patient.  Past Medical History:  Diagnosis Date   Allergic rhinitis    Anxiety    Asthma    Bipolar 2 disorder (HCC)    Bulging lumbar disc    C SPINE   Depression    Gastritis    GANEM   History of palpitations    HX OF PAC'S AND HX OF PVC'S   Hx of smoking    Knee pain, right    PFO (patent foramen ovale)    PONV (postoperative nausea and vomiting)     Sleep apnea     Tobacco History: Social History   Tobacco Use  Smoking Status Former   Current packs/day: 0.00   Average packs/day: 1.5 packs/day for 26.0 years (39.0 ttl pk-yrs)   Types: Cigarettes   Start date: 05/24/1975   Quit date: 05/23/2001   Years since quitting: 22.8  Smokeless Tobacco Never  Tobacco Comments   Smoked for about 30 years, about 1.5-2ppd   Counseling given: Not Answered Tobacco comments: Smoked for about 30 years, about 1.5-2ppd   Outpatient Medications Prior to Visit  Medication Sig Dispense Refill   acetaminophen  (TYLENOL ) 500 MG tablet Take 1,000 mg by mouth every 6 (six) hours as needed for mild pain, fever or headache.     acyclovir (ZOVIRAX) 400 MG tablet Take 400 mg by mouth 3 (three) times daily as needed (For 10 days as needed for outbreak).     albuterol (VENTOLIN HFA) 108 (90 Base) MCG/ACT inhaler Inhale 1-2 puffs into the lungs every 4 (four) hours as needed for wheezing or shortness of breath. 1 each 5   ARIPiprazole (ABILIFY) 5 MG tablet Take 2.5 mg by mouth daily.     citalopram (CELEXA) 10 MG tablet Take 2.5 mg by mouth daily. (Patient taking differently: Take 5 mg by mouth daily.)     fexofenadine (ALLEGRA) 180 MG tablet Take 180 mg by mouth daily as needed for allergies or rhinitis.     fluticasone  furoate-vilanterol (BREO ELLIPTA ) 200-25 MCG/ACT AEPB Inhale 1 puff into the lungs daily. 1 each 11   lithium carbonate (LITHOBID) 300 MG CR tablet Take 600 mg by mouth at bedtime.     LORazepam (ATIVAN) 0.5 MG tablet Take 0.25-0.5 mg by mouth every 6 (six) hours as needed for anxiety, seizure or sedation.     meclizine (ANTIVERT) 25 MG tablet Take 25 mg by mouth 2 (two) times daily as needed for dizziness or nausea.     methocarbamol (ROBAXIN) 750 MG tablet Take 750 mg by mouth every 6 (six) hours as needed for muscle spasms.     ondansetron  (ZOFRAN ) 4 MG tablet Take 1 tablet (4 mg total) by mouth every 8 (eight) hours as needed for up to 20 doses  for nausea or vomiting. 20 tablet 0   fluticasone  (FLONASE SENSIMIST ) 27.5 MCG/SPRAY nasal spray Place 1 spray into the nose daily as needed for rhinitis or allergies. (Patient not taking: Reported on 04/05/2024)     traMADol  (ULTRAM ) 50 MG tablet Take 1 tablet (50 mg total) by mouth every 8 (eight) hours as needed for up to 20 doses for moderate pain (pain score 4-6) or severe pain (pain score 7-10). (Patient not taking: Reported on 04/05/2024) 20 tablet 0   No facility-administered medications prior to visit.  Review of Systems:   Constitutional:   No  weight loss, night sweats,  Fevers, chills, +fatigue, or  lassitude.  HEENT:   No headaches,  Difficulty swallowing,  Tooth/dental problems, or  Sore throat,                No sneezing, itching, ear ache, nasal congestion, post nasal drip,   CV:  No chest pain,  Orthopnea, PND, swelling in lower extremities, anasarca, dizziness, palpitations, syncope.   GI  No heartburn, indigestion, abdominal pain, nausea, vomiting, diarrhea, change in bowel habits, loss of appetite, bloody stools.   Resp:   No chest wall deformity  Skin: no rash or lesions.  GU: no dysuria, change in color of urine, no urgency or frequency.  No flank pain, no hematuria   MS:  No joint pain or swelling.  No decreased range of motion.  No back pain.    Physical Exam  BP 112/70   Pulse 94   Ht 5' 3 (1.6 m) Comment: Per pt  Wt 180 lb 12.8 oz (82 kg)   LMP  (LMP Unknown) Comment: no periods >63yrs  SpO2 94% Comment: RA  BMI 32.03 kg/m   GEN: A/Ox3; pleasant , NAD, well nourished    HEENT:  [place]/AT,  NOSE-clear, THROAT-clear, no lesions, no postnasal drip or exudate noted.   NECK:  Supple w/ fair ROM; no JVD; normal carotid impulses w/o bruits; no thyromegaly or nodules palpated; no lymphadenopathy.    RESP  Clear  P & A; w/o, wheezes/ rales/ or rhonchi. no accessory muscle use, no dullness to percussion  CARD:  RRR, no m/r/g, no peripheral edema, pulses  intact, no cyanosis or clubbing.  GI:   Soft & nt; nml bowel sounds; no organomegaly or masses detected.   Musco: Warm bil, no deformities or joint swelling noted.   Neuro: alert, no focal deficits noted.    Skin: Warm, no lesions or rashes    Lab Results:Reviewed 04/05/2024   CBC   BNP No results found for: BNP    Imaging: No results found.  Administration History     None          Latest Ref Rng & Units 09/15/2022    9:42 AM  PFT Results  FVC-Pre L 3.07   FVC-Predicted Pre % 90   FVC-Post L 3.16   FVC-Predicted Post % 93   Pre FEV1/FVC % % 78   Post FEV1/FCV % % 80   FEV1-Pre L 2.40   FEV1-Predicted Pre % 89   FEV1-Post L 2.52   DLCO uncorrected ml/min/mmHg 17.77   DLCO UNC% % 87   DLCO corrected ml/min/mmHg 17.77   DLCO COR %Predicted % 87   DLVA Predicted % 85   TLC L 5.24   TLC % Predicted % 105   RV % Predicted % 105     No results found for: NITRICOXIDE     05/02/2023   10:00 AM  Results of the [facility] flowsheet  Sitting and reading 3  Watching TV 2  Sitting, inactive in a public place (e.g. a theatre or a meeting) 0  As a passenger in a car for an hour without a break 0  Lying down to rest in the afternoon when circumstances permit 2  Sitting and talking to someone 0  Sitting quietly after a lunch without alcohol 1  In a car, while stopped for a few minutes in traffic 0  Total score 8  Assessment & Plan:   Assessment and Plan Assessment & Plan Obstructive sleep apnea improved compliance and control with perceived benefit. Managed with CPAP therapy,  . The CPAP machine is effective with minimal apnea events. Her weight has decreased from 186 lbs to 180 lbs since the last sleep study. She experiences dryness in the nose and mouth, possibly due to CPAP use and nasal polyps. A CPAP pillow is used to elevate the neck, which may help with breathing. She uses CPAP for an average of four hours per night, occasionally six.    Continue CPAP therapy with current settings. Encourage consistent use for at least four -6 hours per night, aiming for six. Use saline nasal spray and AYR nasal gel to alleviate nasal dryness. Contact Dr. Micky for oral appliance evaluation in case she would like to change. . Ensure CPAP supplies are received as per insurance guidelines.  Moderate persistent asthma  -stable  Managed with Breo and a rescue inhaler, she reports being out of breath during activities,   No recent asthma flare-ups reported. Continue Breo and rescue inhaler as prescribed. Rinse mouth after using inhalers to prevent oral side effects.  History of nasal polyps  and Chronic rhinitis -stable She reports nasal dryness and mouth breathing, possibly exacerbated by CPAP use. Use saline nasal spray and AYR nasal gel to manage nasal dryness. Consider nasal saline rinse for additional relief.        Madelin Stank, NP 04/05/2024

## 2024-04-22 ENCOUNTER — Ambulatory Visit: Admitting: Student

## 2024-05-06 ENCOUNTER — Encounter: Payer: Self-pay | Admitting: Student

## 2024-05-06 ENCOUNTER — Ambulatory Visit: Admitting: Student

## 2024-05-06 VITALS — BP 134/86 | HR 59 | Temp 98.6°F | Wt 181.0 lb

## 2024-05-06 DIAGNOSIS — N6459 Other signs and symptoms in breast: Secondary | ICD-10-CM | POA: Diagnosis not present

## 2024-05-06 DIAGNOSIS — N62 Hypertrophy of breast: Secondary | ICD-10-CM

## 2024-05-06 NOTE — Progress Notes (Signed)
 Referring Provider Dayna Motto, DO [address] [place],  [place] [address]   CC:  Chief Complaint  Patient presents with   Follow-up      Mariah Vaughn is an 58 y.o. female.  HPI: Patient is a 58 year old female who underwent bilateral breast reduction Dr. Lowery on 08/30/2023.  Patient is about 8 months postop.  She presents to the clinic today for follow-up.  Patient was last seen in the clinic on 01/19/2024.  At this visit, patient reported she was doing well.  On exam, breasts were soft and overall symmetric.  There is firmness noted out to the lateral breast which seem to have softened up significantly.  There was a little bit of irritation noted to the superior aspect of the NAC's bilaterally.  There were no signs of infection.  Recommended that patient clean the area with Vashe to see if this might help with the irritation.  Recommend she continue to massage the areas of firmness.  Today, patient reports she is overall doing well.  She states that she still gets burning sensations from time to time, especially out on the sides.  She states that the firmness to the lateral breasts has been softening up, but she has not been massaging as often as she was before.  She states that the firmness has not gotten bigger and it does not bother her.  She reports that the irritation around her NAC's has improved.  She states she has flareups from time to time, but she thinks that she was having a reaction to the [facility].  Patient also states that she feels her breasts have dropped significantly since surgery.  She otherwise denies any other issues or concerns.  Review of Systems General: Does not report any fevers or chills.  Physical Exam    05/06/2024    9:44 AM 04/05/2024   11:01 AM 02/26/2024    3:36 PM  Vitals with BMI  Height  5' 3 5' 3  Weight 181 lbs 180 lbs 13 oz 179 lbs 3 oz  BMI  32.04 31.75  Systolic 134 112 883  Diastolic 86 70 74  Pulse 59 94 102    General:   No acute distress,  Alert and oriented, Non-Toxic, Normal speech and affect Chaperone present on exam.  On exam, patient sitting upright in no acute distress.  Breasts are overall soft and symmetric.  There still is a little bit of firmness noted to the lateral breasts consistent with a little bit of fat necrosis.  There is no overlying skin changes.  No tenderness to palpation.  NAC's are healthy.  Incisions are well-healed.  Irritation around the NAC's appears to be improved from the previous exam.  There are no signs of infection on exam.  Assessment/Plan  Symptomatic mammary hypertrophy   Recommended that patient continue to massage the areas of firmness and continue to monitor them.  Discussed with her to call us  back if she feels the firmness gets larger.  Recommended she continue with her mammogram as scheduled.  She states that she is scheduled for mammogram in April per breast center recommendations.  Recommended that patient try Silagen silicone scar cream for her incisions if she would like since she had a reaction to the Moderma.  I discussed with the patient that over time, her breasts will continue to drop.  I discussed with the patient we can have her do a consult with Dr. Lowery if she would like to discuss her  breast dropping.  Patient states that she does not want any more surgery at this time.  Patient to follow-up as needed.  Instructed her to call if she has any questions or concerns about anything.  Pictures were obtained of the patient and placed in the chart with the patient's or guardian's permission.   Mariah Vaughn 05/06/2024, 10:55 AM

## 2024-05-07 ENCOUNTER — Telehealth: Payer: Self-pay | Admitting: *Deleted

## 2024-05-07 NOTE — Telephone Encounter (Signed)
 Patient is asking for other options,has she sleeps on her side and she sleeps with her head elevated and it still does not help,requesting other options such as inspire or anything to help with her current quality of sleep states she wakes up not breathing a couple times a night,cannot tolerate cpap or oral appliance  Wanted dr.youngs opinion

## 2024-05-07 NOTE — Telephone Encounter (Signed)
ATC x1.  LMTCB. 

## 2024-05-07 NOTE — Telephone Encounter (Signed)
 HST 05/14/23- AHI 8.2/hr, desat to 85%, body weight 186 lbs  Would recommend positional sleep and weight loss for mild OSA -unable to tolerate CPAP or oral appliance  Keep HOB elevated at 30 degrees/incline and avoid supine sleeping Continue with healthy weight loss .

## 2024-05-07 NOTE — Telephone Encounter (Signed)
 Order- referral to [facility] ENT (Dr Tobie, [place]entin.)  consider surgical options for OSA

## 2024-05-07 NOTE — Telephone Encounter (Signed)
 Please advise:  Pt reports CPAP is waking her up every 2 hours, and is only getting 3-4 hours of sleep average.   Her lack of sleep is concerning her and her psychiatrist, due to a history of bipolar disease. She notes she would not be able to use a mouthpeice due to concerns of developing TMJ.  She is requesting call back to discuss other forms of treatment as she will not be able to tolerate the CPAP.  CB#  [phone]  Last OV 04/05/24. Settings auto 4-8 cwp. Continue on BREO 1 puff daily, rinse after use.  Albuterol  inhaler As needed     Continue on CPAP At bedtime, use each night all night long.  Work on healthy weight loss  Do not drive if sleepy  Saline nasal Twice daily   Saline nasal gel At bedtime  -AYR Refer to Orthodontics for oral appliance (Dr [phone])   Follow up in  6 months and As needed  -[place]rett NP or Neda MD

## 2024-05-08 ENCOUNTER — Encounter (INDEPENDENT_AMBULATORY_CARE_PROVIDER_SITE_OTHER): Payer: Self-pay

## 2024-05-08 NOTE — Telephone Encounter (Signed)
 Order placed patient informed and agreeable.NFN

## 2024-06-04 NOTE — Telephone Encounter (Signed)
 She uses [employer]-she can contact them and turn her CPAP machine back in if they are still billing her for CPAP monthly installments she can also talk to them about that process  Would recommend positional sleep with head of the bed elevated at 30 degrees remain off of her back to avoid supine sleeping.

## 2024-06-04 NOTE — Telephone Encounter (Signed)
 Copied from CRM #[id number]. Topic: Clinical - Medical Advice >> May 31, 2024 12:11 PM Devaughn RAMAN wrote: Reason for CRM: Pt stated Dr.Young referred her to the ENT and she goes to see the specialist January 15th but pt stated she has not been able to use the CPAP machine in the last week and a half, pt stated she has bipolar disorder and the CPAP machine was waking her up every 2 hours, and she was only getting 4-5hrs of sleep, now she is getting about 10 hours without using the CPAP machine, pt stated she will probably not be using the machine and she wanted to inform NP Tammy, pt would like to know what she should do with the machine, please f/u and speak with the pt regarding this. Pt is concerned about getting billed.  -----------------------------------------------------------------------------------------------------------------------------------------------  Tammy, Please advise regarding patient plans not to continue using CPAP machine and wants to know what she should do with the CPAP machine.  Please advise if she should return it to DME company?  Thank you.

## 2024-06-05 NOTE — Telephone Encounter (Signed)
 Patient notified. She has appt with ENT and will await until then. Her machine will be paid for in March so she is going to try and keep it. NFN

## 2024-06-11 ENCOUNTER — Institutional Professional Consult (permissible substitution) (INDEPENDENT_AMBULATORY_CARE_PROVIDER_SITE_OTHER): Admitting: Otolaryngology

## 2024-07-10 ENCOUNTER — Institutional Professional Consult (permissible substitution) (INDEPENDENT_AMBULATORY_CARE_PROVIDER_SITE_OTHER): Admitting: Otolaryngology
